# Patient Record
Sex: Male | Born: 1975 | Hispanic: No | Marital: Married | Smoking: Current every day smoker
Health system: Southern US, Community
[De-identification: ages and names within clinical notes are randomized; demographics above are authoritative.]

---

## 2002-05-28 ENCOUNTER — Encounter: Admission: RE | Admit: 2002-05-28 | Discharge: 2002-05-28 | Payer: Self-pay | Admitting: Gastroenterology

## 2002-05-28 ENCOUNTER — Encounter: Payer: Self-pay | Admitting: Gastroenterology

## 2006-12-11 ENCOUNTER — Encounter: Admission: RE | Admit: 2006-12-11 | Discharge: 2006-12-11 | Payer: Self-pay | Admitting: Internal Medicine

## 2010-09-16 ENCOUNTER — Encounter: Payer: Self-pay | Admitting: Internal Medicine

## 2013-06-20 ENCOUNTER — Emergency Department (HOSPITAL_COMMUNITY): Payer: BC Managed Care – PPO

## 2013-06-20 ENCOUNTER — Emergency Department (HOSPITAL_COMMUNITY)
Admission: EM | Admit: 2013-06-20 | Discharge: 2013-06-20 | Disposition: A | Discharge status: Home / Self Care | Payer: BC Managed Care – PPO | Attending: Emergency Medicine | Admitting: Emergency Medicine

## 2013-06-20 ENCOUNTER — Encounter (HOSPITAL_COMMUNITY): Payer: Self-pay | Admitting: Emergency Medicine

## 2013-06-20 DIAGNOSIS — N452 Orchitis: Secondary | ICD-10-CM | POA: Insufficient documentation

## 2013-06-20 DIAGNOSIS — N453 Epididymo-orchitis: Secondary | ICD-10-CM

## 2013-06-20 DIAGNOSIS — F17200 Nicotine dependence, unspecified, uncomplicated: Secondary | ICD-10-CM | POA: Insufficient documentation

## 2013-06-20 LAB — URINALYSIS, ROUTINE W REFLEX MICROSCOPIC
Bilirubin Urine: NEGATIVE
Glucose, UA: NEGATIVE mg/dL
Hgb urine dipstick: NEGATIVE
Ketones, ur: NEGATIVE mg/dL
Leukocytes, UA: NEGATIVE
Nitrite: NEGATIVE
Protein, ur: NEGATIVE mg/dL
Specific Gravity, Urine: 1.011 (ref 1.005–1.030)
Urobilinogen, UA: 0.2 mg/dL (ref 0.0–1.0)
pH: 6 (ref 5.0–8.0)

## 2013-06-20 MED ORDER — LIDOCAINE HCL 1 % IJ SOLN
INTRAMUSCULAR | Status: AC
  Administered 2013-06-20: 0.9 mL
  Filled 2013-06-20: qty 20

## 2013-06-20 MED ORDER — OXYCODONE-ACETAMINOPHEN 5-325 MG PO TABS
1.0000 | ORAL_TABLET | Freq: Once | ORAL | Status: AC
  Administered 2013-06-20: 1 via ORAL
  Filled 2013-06-20: qty 1

## 2013-06-20 MED ORDER — DOXYCYCLINE HYCLATE 100 MG PO CAPS: 100.0000 mg | ORAL_CAPSULE | Freq: Two times a day (BID) | ORAL | Status: DC

## 2013-06-20 MED ORDER — DOXYCYCLINE HYCLATE 100 MG PO TABS
100.0000 mg | ORAL_TABLET | Freq: Once | ORAL | Status: AC
  Administered 2013-06-20: 100 mg via ORAL
  Filled 2013-06-20: qty 1

## 2013-06-20 MED ORDER — OXYCODONE-ACETAMINOPHEN 5-325 MG PO TABS: 1.0000 | ORAL_TABLET | Freq: Four times a day (QID) | ORAL | Status: DC | PRN

## 2013-06-20 MED ORDER — CEFTRIAXONE SODIUM 250 MG IJ SOLR
250.0000 mg | Freq: Once | INTRAMUSCULAR | Status: AC
  Administered 2013-06-20: 250 mg via INTRAMUSCULAR
  Filled 2013-06-20: qty 250

## 2013-06-20 NOTE — ED Notes (Signed)
Pt lifting something heavy three days ago and for past two days pt having left side abd pain and groin pain.  Pt states he is unable to walk. Pt ambulated in to lobby with steady gait.

## 2013-06-20 NOTE — ED Provider Notes (Signed)
CSN: [id number]     Arrival date & time 06/20/13  1347 History   First MD Initiated Contact with Patient 06/20/13 1350     Chief Complaint  Patient presents with  . Abdominal Pain  . Groin Pain   (Consider location/radiation/quality/duration/timing/severity/associated sxs/prior Treatment) HPI Comments: Patient presents with complaint of left groin pain which began acutely 2 days ago and has gradually worsening. Patient reports that 3 days ago he was lifting a heavy TV stand and had onset of discomfort which improved. Patient reports worsening pain. Pain is worse with movement and palpation. He has had no urinary symptoms or difficulties. He is having normal bowel movements and passing gas. He's been taking ibuprofen with some relief. No history of similar symptoms. No history of abdominal surgeries.  Patient is a 37 y.o. male presenting with abdominal pain and groin pain. The history is provided by the patient.  Abdominal Pain Associated symptoms: no chest pain, no cough, no diarrhea, no dysuria, no fever, no hematuria, no nausea, no sore throat and no vomiting   Groin Pain Associated symptoms include abdominal pain. Pertinent negatives include no chest pain, coughing, fever, headaches, myalgias, nausea, rash, sore throat or vomiting.    History reviewed. No pertinent past medical history. History reviewed. No pertinent past surgical history. No family history on file. History  Substance Use Topics  . Smoking status: Current Every Day Smoker -- 10.00 packs/day    Types: Cigarettes  . Smokeless tobacco: Not on file  . Alcohol Use: Yes    Review of Systems  Constitutional: Negative for fever.  HENT: Negative for rhinorrhea and sore throat.   Eyes: Negative for redness.  Respiratory: Negative for cough.   Cardiovascular: Negative for chest pain.  Gastrointestinal: Positive for abdominal pain. Negative for nausea, vomiting and diarrhea.  Genitourinary: Positive for scrotal swelling  and testicular pain. Negative for dysuria, urgency, frequency, hematuria, discharge, penile swelling and penile pain.  Musculoskeletal: Negative for myalgias.  Skin: Negative for rash.  Neurological: Negative for headaches.    Allergies  Review of patient's allergies indicates no known allergies.  Home Medications   Current Outpatient Rx  Name  Route  Sig  Dispense  Refill  . ibuprofen (ADVIL,MOTRIN) 200 MG tablet   Oral   Take 600 mg by mouth every 6 (six) hours as needed for pain.          BP 118/88  Pulse 92  Temp(Src) 97.9 F (36.6 C) (Oral)  Resp 16  SpO2 97% Physical Exam  Nursing note and vitals reviewed. Constitutional: He appears well-developed and well-nourished.  HENT:  Head: Normocephalic and atraumatic.  Eyes: Conjunctivae are normal. Right eye exhibits no discharge. Left eye exhibits no discharge.  Neck: Normal range of motion. Neck supple.  Cardiovascular: Normal rate, regular rhythm and normal heart sounds.   Pulmonary/Chest: Effort normal and breath sounds normal.  Abdominal: Soft. There is no tenderness.  Genitourinary: Penis normal.    Left testis shows swelling and tenderness. Left testis shows no mass. Left testis is descended. No penile tenderness. No discharge found.  Lymphadenopathy:       Left: No inguinal adenopathy present.  Neurological: He is alert.  Skin: Skin is warm and dry.  Psychiatric: He has a normal mood and affect.    ED Course  Procedures (including critical care time) Labs Review Labs Reviewed  URINALYSIS, ROUTINE W REFLEX MICROSCOPIC - Abnormal; Notable for the following:    APPearance CLOUDY (*)    All other components  within normal limits   Imaging Review No results found.  EKG Interpretation   None      Patient seen and examined. Work-up initiated. [place] ordered. Patient does not want pain medication.   Vital signs reviewed and are as follows: Filed Vitals:   06/20/13 1409  BP: 118/88  Pulse: 92  Temp: 97.9  F (36.6 C)  Resp: 16   3:33 PM Pt in ultrasound.   4:09 PM ultrasound reviewed by myself. Patient informed of results. Rocephin and doxycycline ordered. Patient requesting pain medication.  Patient has primary care physicians with whom he can followup in 3 days if not improved. Urged to return with worsening symptoms, persistent fever, or other concerns. Patient verbalizes understanding and agrees with plan.  Patient counseled on use of narcotic pain medications. Counseled not to combine these medications with others containing tylenol. Urged not to drink alcohol, drive, or perform any other activities that requires focus while taking these medications. The patient verbalizes understanding and agrees with the plan.   MDM   1. Epididymo-orchitis, acute    Testicular pain: [place] c/w orchitis. Abx given (rocephin, doxycycline). Less likely inguinal hernia.     Renne Crigler, PA-C 06/20/13 [phone]

## 2013-06-24 NOTE — ED Provider Notes (Signed)
Medical screening examination/treatment/procedure(s) were performed by non-physician practitioner and as supervising physician I was immediately available for consultation/collaboration.  EKG Interpretation   None         Kristen N Ward, DO 06/24/13 1526 

## 2015-04-11 IMAGING — US US ART/VEN ABD/PELV/SCROTUM DOPPLER LTD
1 series · 13 of 25 positions shown · non-contrast
Comparison: None.

CLINICAL DATA: Left testicular pain

EXAM:
SCROTAL ULTRASOUND
DOPPLER ULTRASOUND OF THE TESTICLES
TECHNIQUE: Complete ultrasound examination of the testicles, epididymis, and
other scrotal structures was performed. Color and spectral Doppler
ultrasound were also utilized to evaluate blood flow to the
testicles.

[Series 1: us art/ven abd/pelv/scrotum doppler ltd · 0.06mm/px · 13 of 40 slices shown]
[im 1/40]
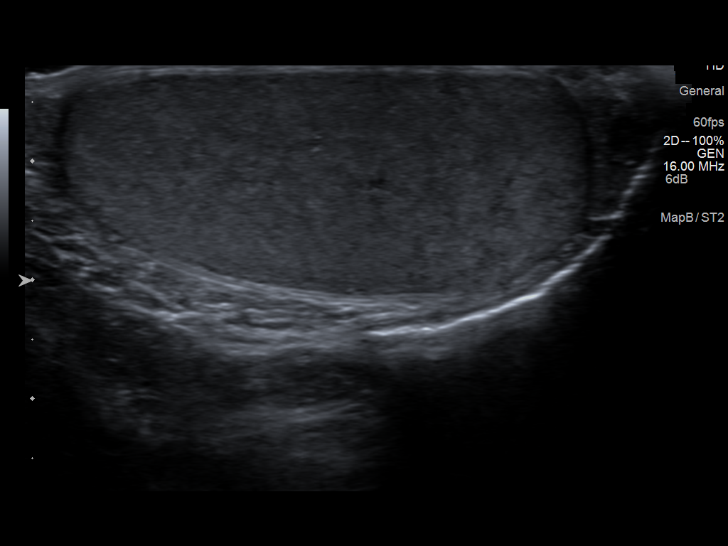
[im 4/40]
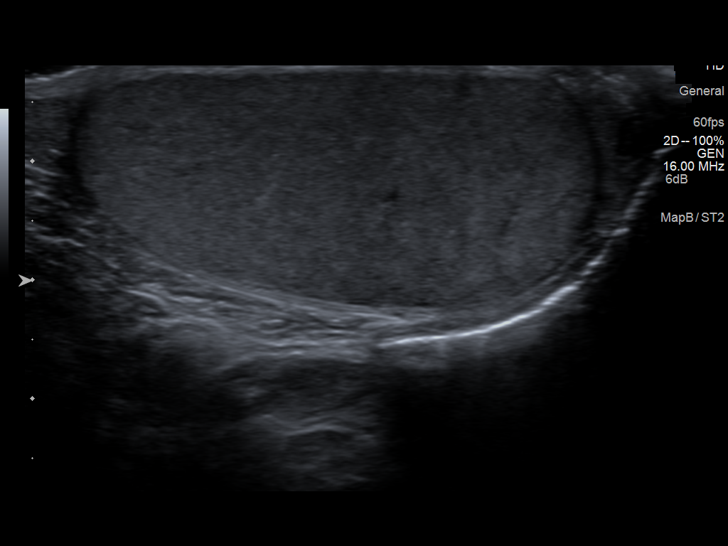
[im 7/40]
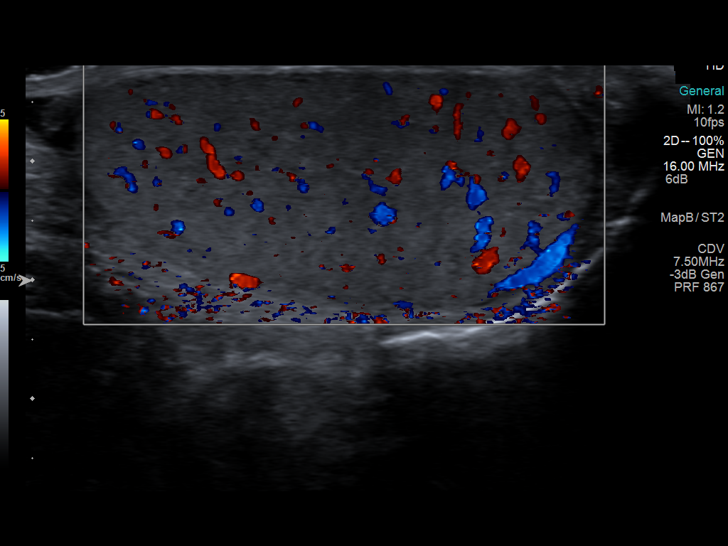
[im 10/40]
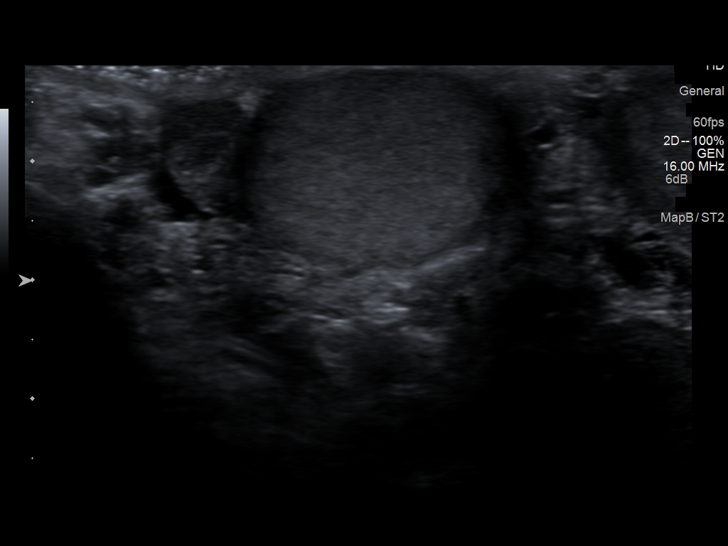
[im 14/40]
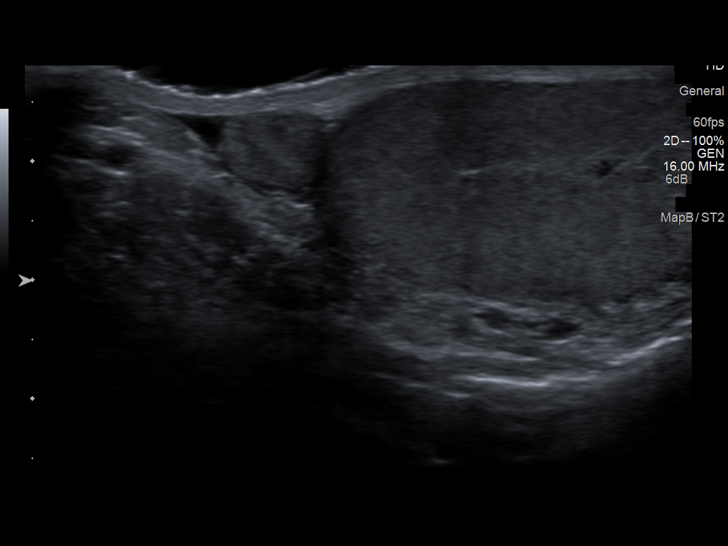
[im 17/40]
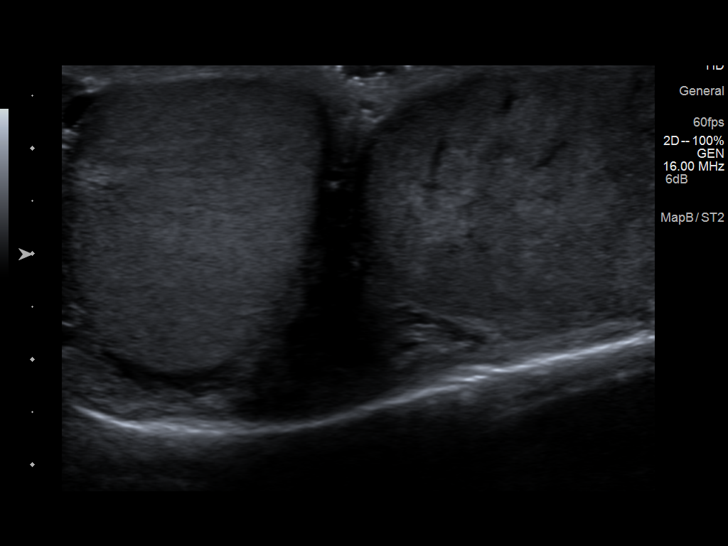
[im 20/40]
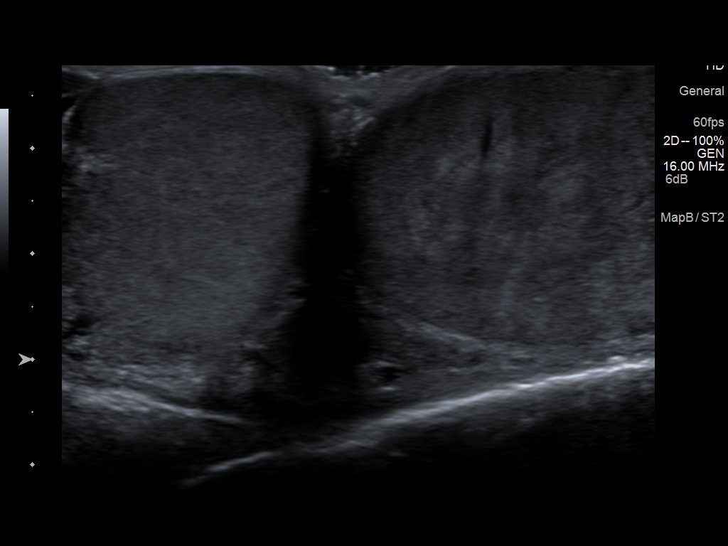
[im 23/40]
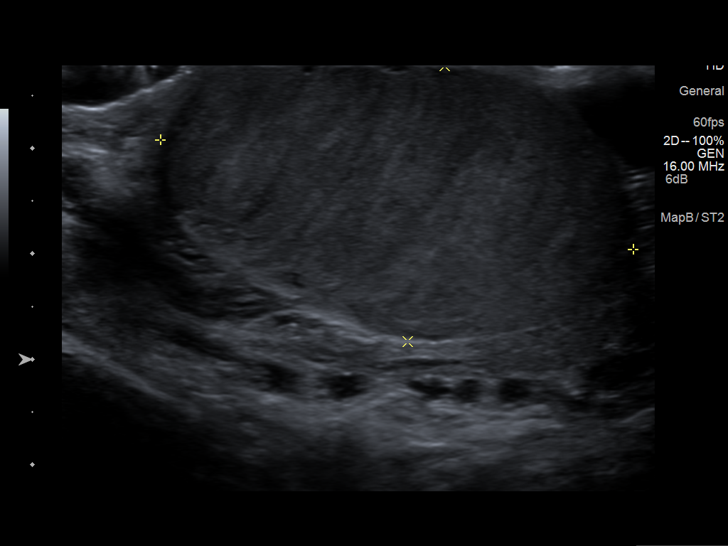
[im 27/40]
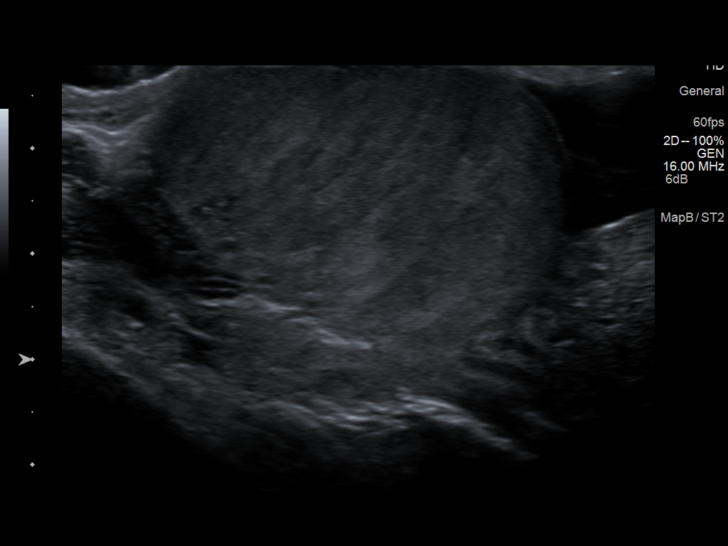
[im 30/40]
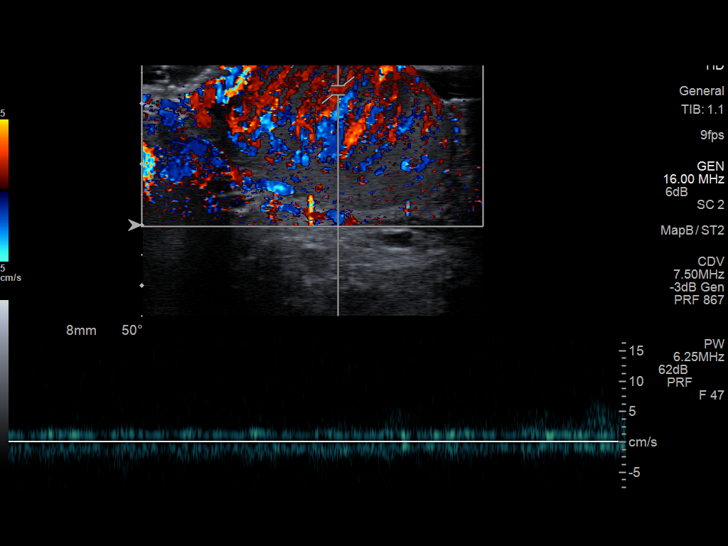
[im 33/40]
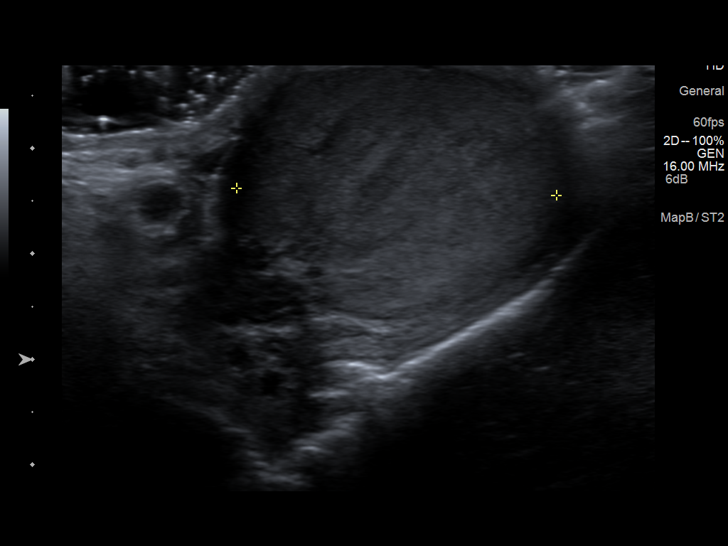
[im 36/40]
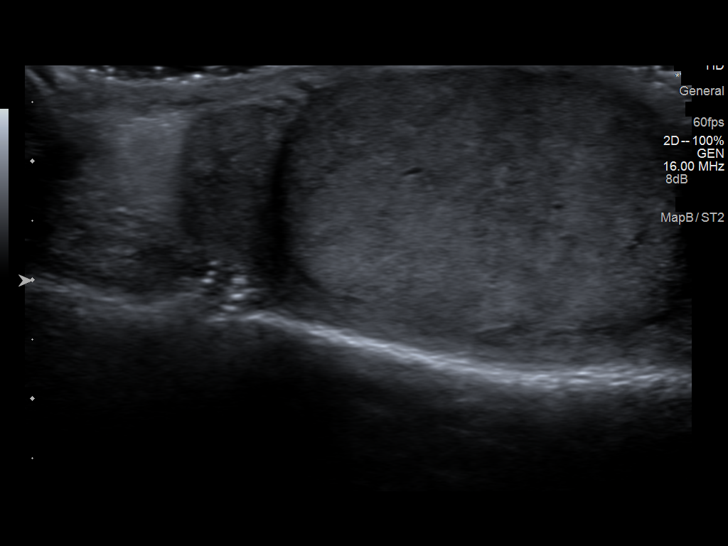
[im 40/40]
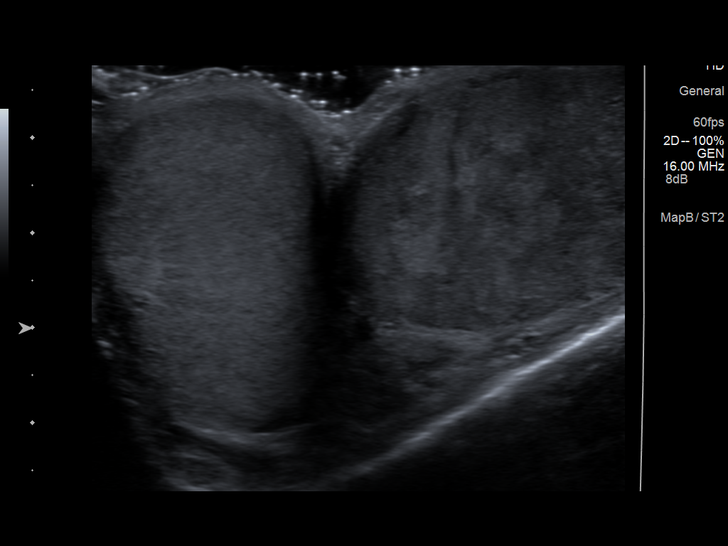

[13 of 25 positions shown; findings below may reference images not displayed]

FINDINGS: Right testicle

Measurements: 4.5 x 1.9 x 2.7 cm No mass or microlithiasis
visualized. There is normal call or Doppler flow. Normal arterial
and venous waveform.

Left testicle

Measurements: 4.6 x 2.6 x 3 cm. No mass or microlithiasis
visualized. No evidence of testicular torsion. Normal arterial and
venous waveform. There is diffuse increased heterogeneous flow in
the left testicle. Findings are highly suspicious for orchitis.
Clinical correlation is necessary.

Right epididymis:  Normal in size and appearance.

Left epididymis:  Normal in size and appearance.

Hydrocele:  Small bilateral hydrocele.

Varicocele:  None visualized.

Pulsed Doppler interrogation of both testes demonstrates low
resistance arterial and venous waveforms bilaterally.
IMPRESSION: 1. Unremarkable right testicle. No evidence of left testicular
torsion. Diffuse increased flow in left testicle highly suspicious
for orchitis. Unremarkable bilateral epididymis. Small bilateral
hydrocele.

## 2017-11-14 ENCOUNTER — Ambulatory Visit (INDEPENDENT_AMBULATORY_CARE_PROVIDER_SITE_OTHER): Payer: No Typology Code available for payment source

## 2017-11-14 ENCOUNTER — Encounter: Payer: Self-pay | Admitting: Podiatry

## 2017-11-14 ENCOUNTER — Ambulatory Visit (INDEPENDENT_AMBULATORY_CARE_PROVIDER_SITE_OTHER): Payer: No Typology Code available for payment source | Admitting: Podiatry

## 2017-11-14 VITALS — BP 108/66 | HR 67 | Resp 16

## 2017-11-14 DIAGNOSIS — M898X9 Other specified disorders of bone, unspecified site: Secondary | ICD-10-CM | POA: Diagnosis not present

## 2017-11-17 NOTE — Progress Notes (Signed)
Subjective:   Patient ID: Francis Alvarez, male   DOB: 42 y.o.   MRN: [id number]   HPI Patient presents stating that he has a very painful lesion on the inside of the fifth toe of his right foot and states that he is tried to trim it and is been to another doctor to work on it and it did not help and it is increasingly making it hard to wear shoe gear.  Patient smokes approximately 1/2 pack/day and likes to be active   Review of Systems  All other systems reviewed and are negative.       Objective:  Physical Exam  Constitutional: He appears well-developed and well-nourished.  Cardiovascular: Intact distal pulses.  Pulmonary/Chest: Effort normal.  Musculoskeletal: Normal range of motion.  Neurological: He is alert.  Skin: Skin is warm.  Nursing note and vitals reviewed.   Neurovascular status found to be intact muscle strength is adequate range of motion within normal limits with patient noted to have keratotic lesion on the inside of the fifth digit right is very painful when pressed with lucent-like core to it and rotation of the fifth digit pressing against the fourth toe.  Patient was noted to have good digital perfusion and is well oriented x3     Assessment:  Strong probability that this is a small bone spur formation with keratotic irritated lesion formation secondary to structure of the digit     Plan:  H&P and x-ray reviewed with patient.  Due to the discomfort and failure to respond to conservative treatment wider shoes and padding I recommended exostectomy and explained this can be done in the office.  I did explain at one point in the future the derotational arthroplasty may be necessary and at this time I let patient read consent form going over alternative treatments and complications.  Patient is willing to accept the risk of surgery and wants this done and signed consent form after extensive review and is scheduled for outpatient surgery at this time.  Patient was given  all instructions understanding recovery will take several months and is encouraged to call with questions  X-rays indicate that there is rotation of the fifth digit right with abnormal pressure between the fourth and fifth toes

## 2017-11-21 ENCOUNTER — Telehealth: Payer: Self-pay | Admitting: *Deleted

## 2017-11-21 NOTE — Telephone Encounter (Addendum)
I am calling to see if we can reschedule your surgery.  You had requested April 10.  Dr. Charlsie Merlesegal will not be in the office that day.  Can we schedule it the following week on April 15 or 17?  "Yes, that will be fine.  Let's do it on April 17."  I will get it rescheduled, be here at 7:45 am.

## 2017-12-02 ENCOUNTER — Other Ambulatory Visit: Payer: Self-pay | Admitting: Family Medicine

## 2017-12-02 ENCOUNTER — Ambulatory Visit
Admission: RE | Admit: 2017-12-02 | Discharge: 2017-12-02 | Disposition: A | Discharge status: Home / Self Care | Payer: No Typology Code available for payment source | Source: Ambulatory Visit | Attending: Family Medicine | Admitting: Family Medicine

## 2017-12-02 DIAGNOSIS — R0789 Other chest pain: Secondary | ICD-10-CM

## 2017-12-05 ENCOUNTER — Telehealth: Payer: Self-pay | Admitting: *Deleted

## 2017-12-05 NOTE — Telephone Encounter (Signed)
I am calling you in regards to your surgery scheduled for 12/10/2017.  You have a pre-existing clause.  Have you been treated for this problem before getting this insurance?  "No, I haven't.  There's no worries if my insurance doesn't cover it. I will take care of everything."  You should be okay if you didn't receive treatment for this problem last year.  "I saw a Dermatologist about it earlier this year."

## 2017-12-10 ENCOUNTER — Encounter: Payer: Self-pay | Admitting: Podiatry

## 2017-12-10 ENCOUNTER — Ambulatory Visit (INDEPENDENT_AMBULATORY_CARE_PROVIDER_SITE_OTHER): Payer: No Typology Code available for payment source | Admitting: Podiatry

## 2017-12-10 VITALS — BP 109/63 | HR 72 | Resp 16

## 2017-12-10 DIAGNOSIS — M898X9 Other specified disorders of bone, unspecified site: Secondary | ICD-10-CM

## 2017-12-10 NOTE — Progress Notes (Signed)
Subjective:   Patient ID: Francis Alvarez, male   DOB: 42 y.o.   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  The following procedure was performed attention was directed dorsal medial aspect digit 5 right were some elliptical incision was made centered over the offending lesion.  The incision was deepened down to bone and the offending skin wedge was removed in toto and the bone was exposed.  Utilizing a bone bur this was rasped down and found to be satisfactorily resected.  The wound was flushed with copies months Garamycin solution and sutured with 4-0 nylon and sterile dressing applied tourniquet released and capillary fill is noted to be immediate to all digits on the toe and the patient left the OR in satisfactory condition

## 2017-12-24 ENCOUNTER — Other Ambulatory Visit: Payer: No Typology Code available for payment source

## 2017-12-26 ENCOUNTER — Encounter: Payer: Self-pay | Admitting: Podiatry

## 2017-12-26 ENCOUNTER — Ambulatory Visit (INDEPENDENT_AMBULATORY_CARE_PROVIDER_SITE_OTHER): Payer: No Typology Code available for payment source | Admitting: Podiatry

## 2017-12-26 DIAGNOSIS — M898X9 Other specified disorders of bone, unspecified site: Secondary | ICD-10-CM

## 2017-12-29 NOTE — Progress Notes (Signed)
Subjective:   Patient ID: Francis Alvarez, male   DOB: 42 y.o.   MRN: [id number]   HPI Patient presents doing well with my right foot with minimal pain and I have been back working since a couple days after surgery   ROS      Objective:  Physical Exam  Neurovascular status intact with patient's right fifth digit healing well wound edges well coapted and good alignment noted     Assessment:  Doing well post exostectomy fifth digit right     Plan:  Stitches removed wound edges coapted well and advised this patient on gradual return to activities and applied Band-Aid to the area  X-rays indicate satisfactory resection of bone with no indications of pathology

## 2017-12-30 ENCOUNTER — Telehealth: Payer: Self-pay | Admitting: Podiatry

## 2017-12-30 NOTE — Telephone Encounter (Signed)
Patient had surgery (Exostectomy) on (12/10/17). He came in 12/26/17 and had his stitches removed. He said since then when he goes to put his shoes on he's in a lot of pain. If you can call him back at [phone]

## 2017-12-30 NOTE — Telephone Encounter (Signed)
Pt states he is still having pain and swelling in the little toe after going into the soft plastic shoe. I told pt he may benefit from a soft sided athletic shoe, he may have swelling on and off to varying degrees for 6-9 months, that he should elevate and ice the area 15 minutes periodically

## 2018-01-08 ENCOUNTER — Ambulatory Visit (INDEPENDENT_AMBULATORY_CARE_PROVIDER_SITE_OTHER): Payer: No Typology Code available for payment source

## 2018-01-08 ENCOUNTER — Ambulatory Visit (INDEPENDENT_AMBULATORY_CARE_PROVIDER_SITE_OTHER): Payer: Self-pay | Admitting: Podiatry

## 2018-01-08 DIAGNOSIS — M898X9 Other specified disorders of bone, unspecified site: Secondary | ICD-10-CM

## 2018-01-08 NOTE — Progress Notes (Signed)
Subjective:   Patient ID: Francis Alvarez, male   DOB: 42 y.o.   MRN: [id number]   HPI Patient presents with distal keratotic lesion that is irritated on the end of the fifth digit right not where we did the surgery.  Patient states he is not sure what he may have done but this   ROS      Objective:  Physical Exam  Neurovascular status intact with distal irritated skin right fifth digit with incision site that healed excellent on the medial side fifth toe right     Assessment:  Possibility for some form of inflammatory tissue exostotic tissue distal aspect digit 5 right     Plan:  H&P condition reviewed and applied padding to the area and reviewed x-ray.  Do not see anything else for [place] to do but ultimately we may require more aggressive treatment  X-ray indicates that there is satisfactory resection of bone medial side digit 5 right with no other pathology noted

## 2019-09-23 IMAGING — CR DG CHEST 2V
2 series · 2 of 2 positions shown · non-contrast
Comparison: None.

CLINICAL DATA: Chest pressure, left back pain.

EXAM:
CHEST - 2 VIEW

[w chest pa]
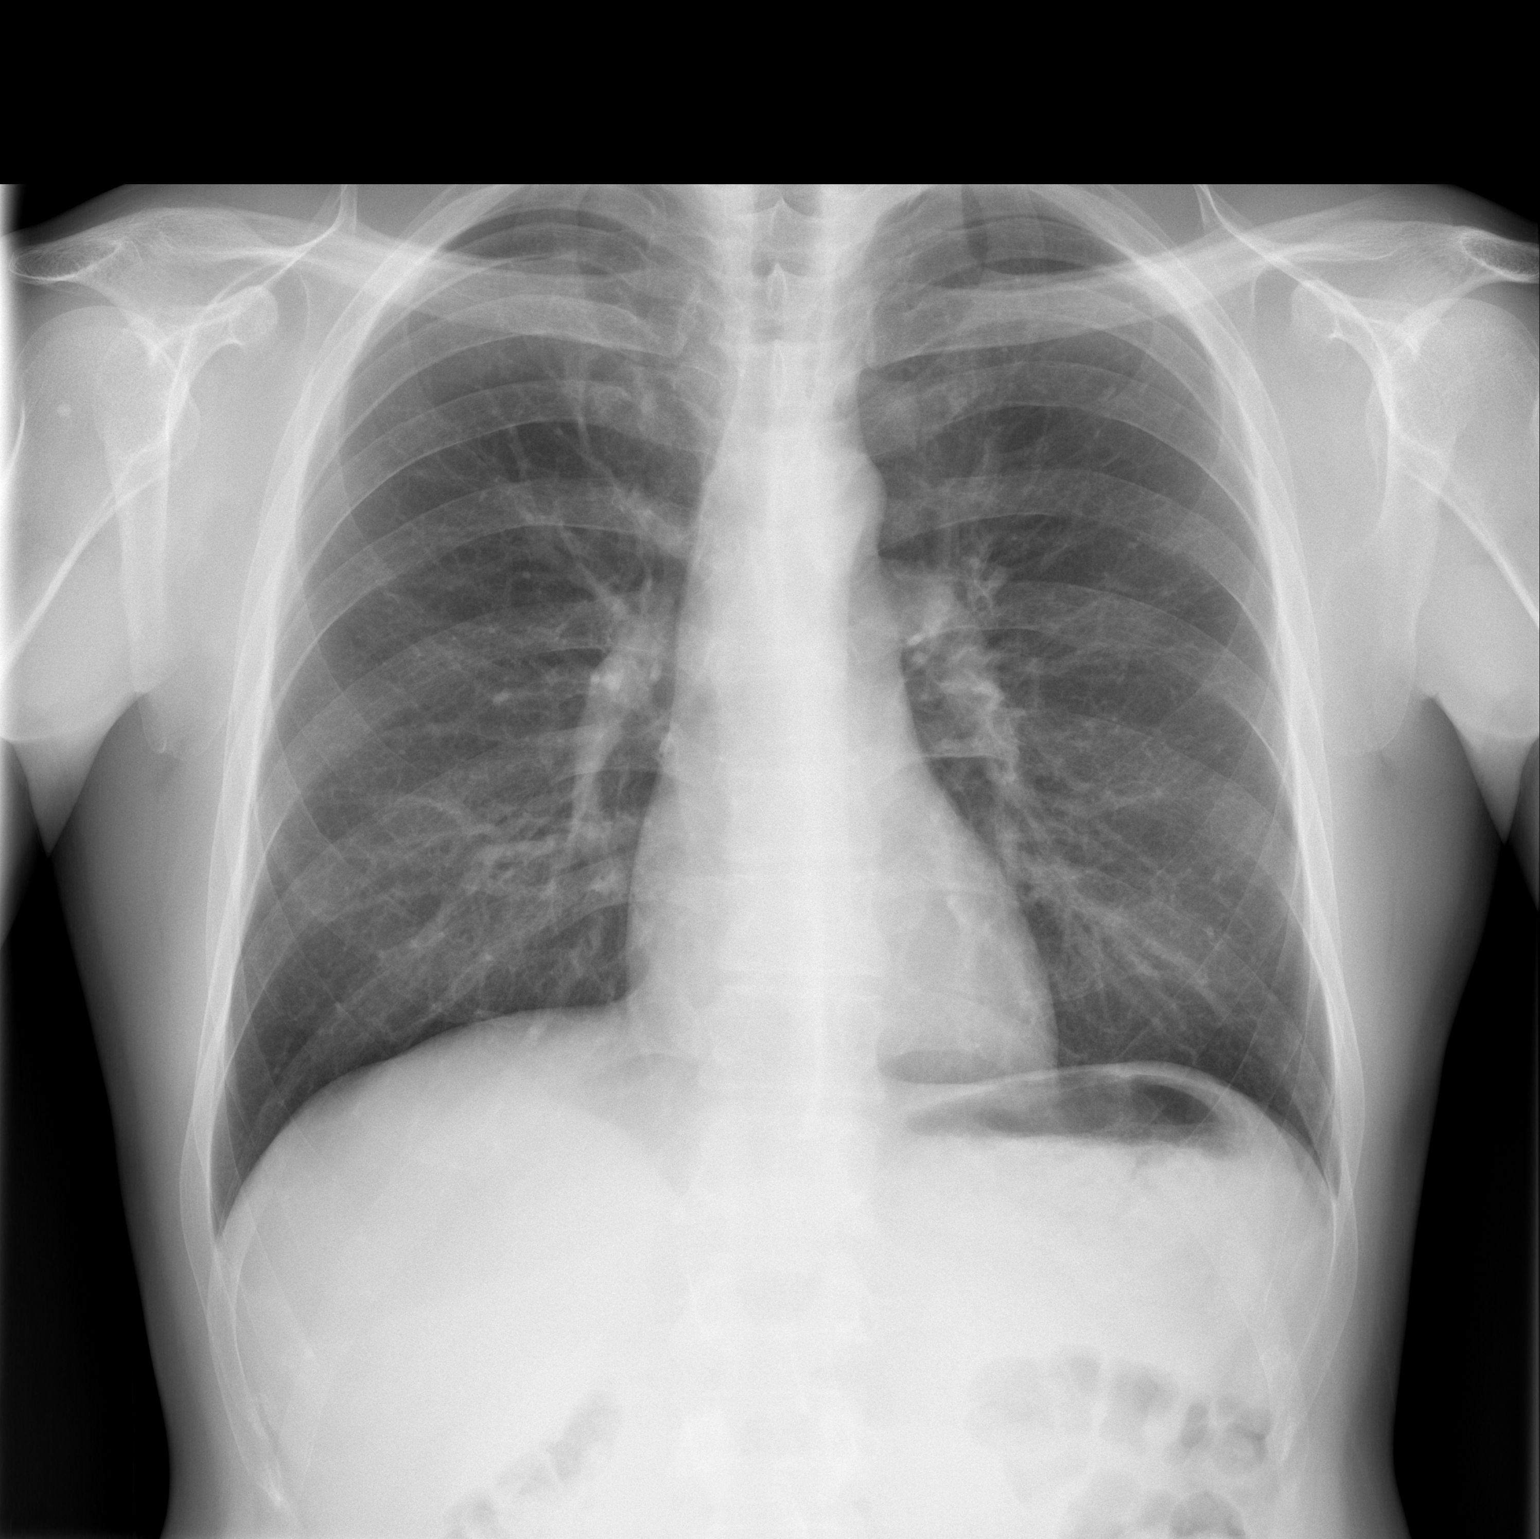

[w chest lat]
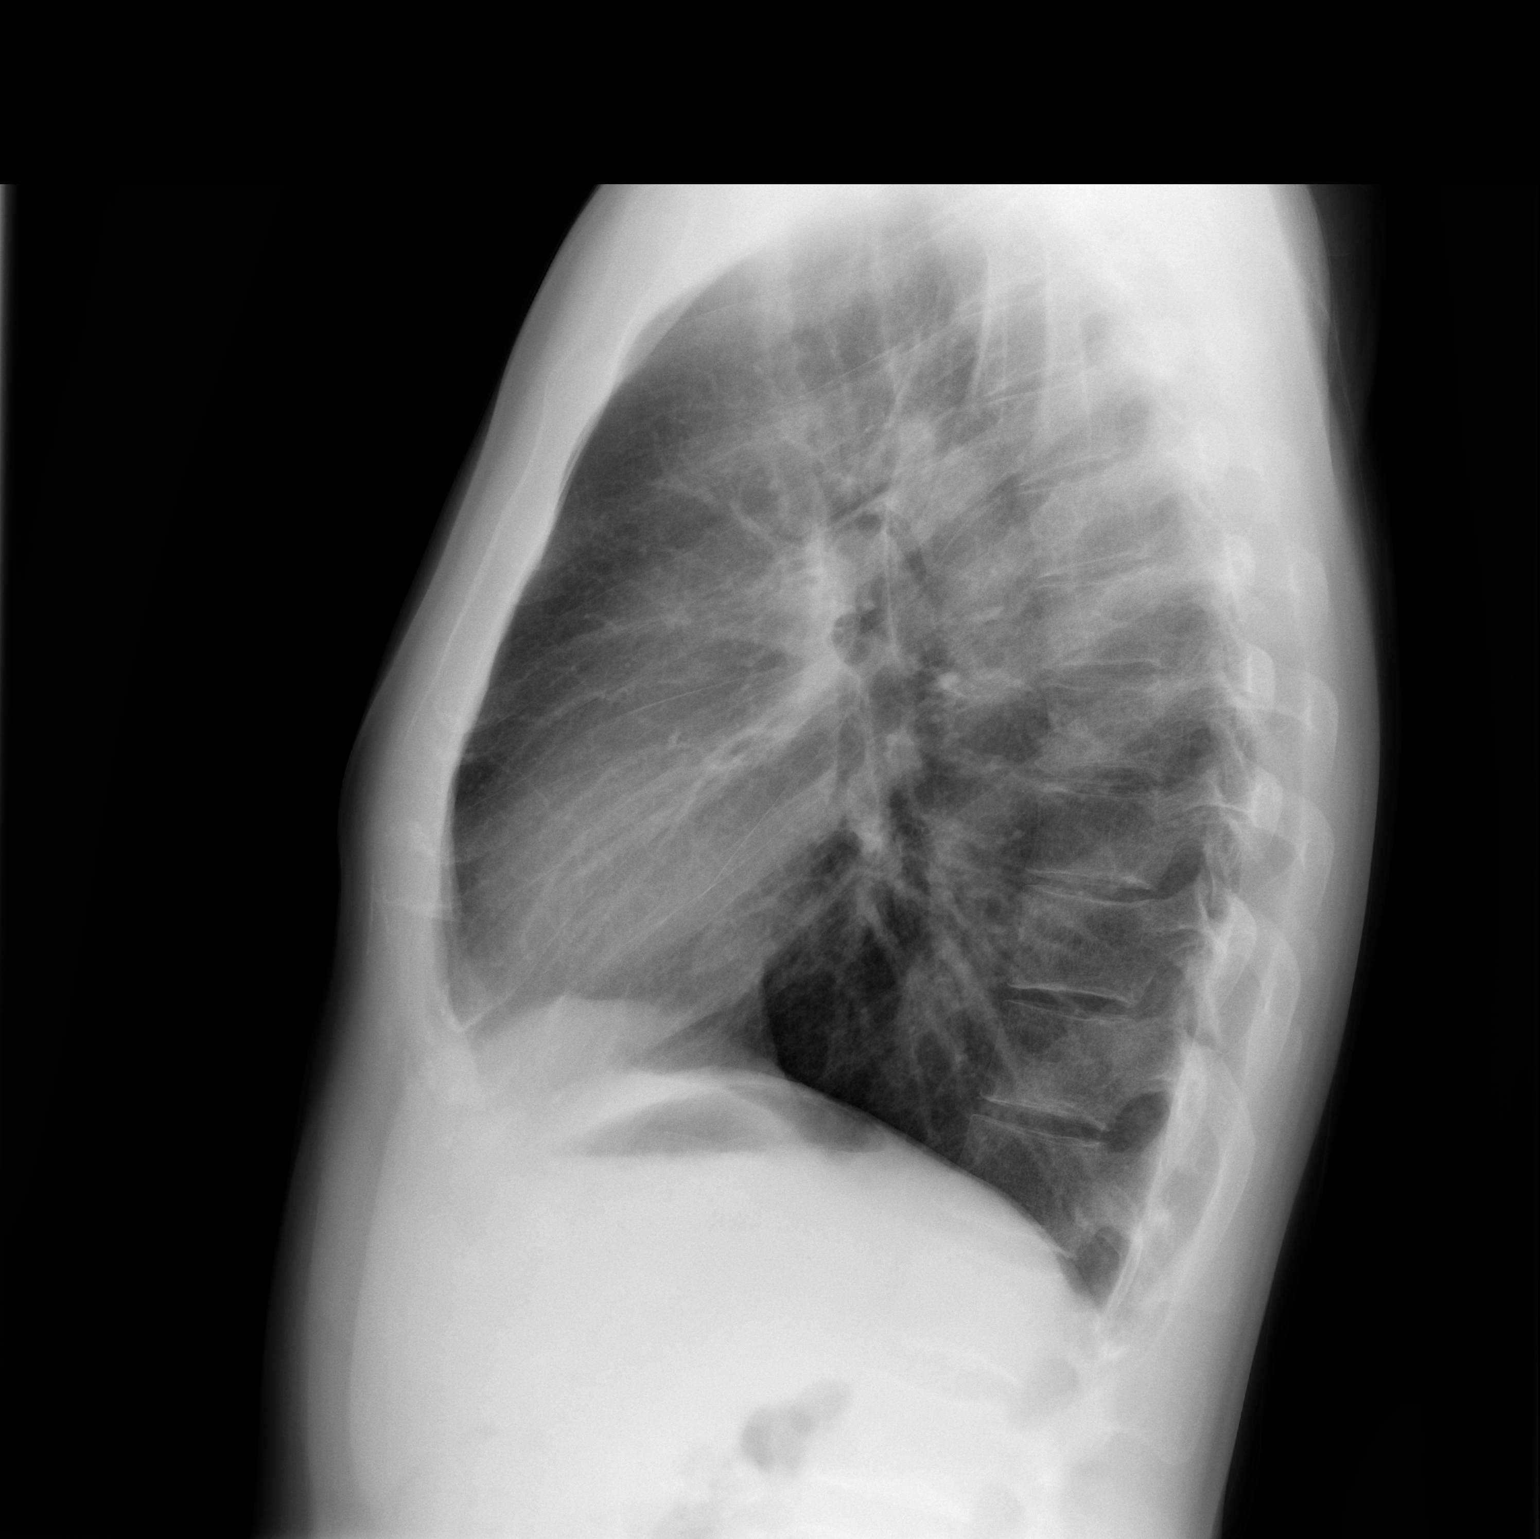

[2 of 2 positions shown; findings below may reference images not displayed]

FINDINGS: Heart and mediastinal contours are within normal limits. No focal
opacities or effusions. No acute bony abnormality.
IMPRESSION: No active cardiopulmonary disease.

## 2021-05-07 ENCOUNTER — Other Ambulatory Visit: Payer: Self-pay

## 2021-05-07 ENCOUNTER — Ambulatory Visit (HOSPITAL_BASED_OUTPATIENT_CLINIC_OR_DEPARTMENT_OTHER): Payer: No Typology Code available for payment source | Attending: Sports Medicine | Admitting: Physical Therapy

## 2021-05-07 ENCOUNTER — Encounter (HOSPITAL_BASED_OUTPATIENT_CLINIC_OR_DEPARTMENT_OTHER): Payer: Self-pay | Admitting: Physical Therapy

## 2021-05-07 DIAGNOSIS — M62830 Muscle spasm of back: Secondary | ICD-10-CM | POA: Diagnosis present

## 2021-05-07 DIAGNOSIS — G8929 Other chronic pain: Secondary | ICD-10-CM | POA: Diagnosis present

## 2021-05-07 DIAGNOSIS — M545 Low back pain, unspecified: Secondary | ICD-10-CM | POA: Diagnosis present

## 2021-05-07 DIAGNOSIS — M6281 Muscle weakness (generalized): Secondary | ICD-10-CM | POA: Diagnosis present

## 2021-05-07 NOTE — Therapy (Signed)
[facility] [address] [place], [place], [address] Phone: [phone]   Fax:  [phone]  Physical Therapy Evaluation  Patient Details  Name: Francis Alvarez MRN: [id number] Date of Birth: 02/26/1976 Referring Provider (PT): Cleophas Dunker   Encounter Date: 05/07/2021   PT End of Session - 05/07/21 0922     Visit Number 1    Number of Visits 13    Date for PT Re-Evaluation 06/22/21    Authorization Type [employer]    PT Start Time 0845    PT Stop Time 0925    PT Time Calculation (min) 40 min    Activity Tolerance Patient tolerated treatment well    Behavior During Therapy [facility] for tasks assessed/performed             History reviewed. No pertinent past medical history.  History reviewed. No pertinent surgical history.  There were no vitals filed for this visit.    Subjective Assessment - 05/07/21 0847     Subjective Started working out around Jan and after a month I started having pain here- pointing to SIJ bilat. Could hardly bend. Now I have a good pinch that is on and off. Was doing kick boxing but was not exercising before and not any more. A little numbness on Sat but it went away and have never had it before.    Patient Stated Goals squatting, bending, lifting pizzas- reaching fwd with Rt arm    Currently in Pain? Yes    Pain Score 2     Pain Location Back    Pain Orientation Lower;Right;Left    Pain Descriptors / Indicators --   pinching   Aggravating Factors  bending, squatting    Pain Relieving Factors rest                [facility] PT Assessment - 05/07/21 0001       Assessment   Medical Diagnosis LBP    Referring Provider (PT) Cleophas Dunker    Onset Date/Surgical Date 09/26/20    Hand Dominance Right    Prior Therapy no      Precautions   Precautions None      Restrictions   Weight Bearing Restrictions No      Balance Screen   Has the patient fallen in the past 6 months No      Prior Function    Vocation Full time employment    Vocation Requirements [employer]      Cognition   Overall Cognitive Status Within Functional Limits for tasks assessed      Sensation   Additional Comments [facility]      Posture/Postural Control   Posture Comments incr lumbar lordosis      ROM / Strength   AROM / PROM / Strength AROM;Strength      AROM   AROM Assessment Site Lumbar    Lumbar Flexion just below knee      Strength   Overall Strength Comments bil hip abd 4/5      Flexibility   Soft Tissue Assessment /Muscle Length yes    Hamstrings <45 deg bil      Palpation   Palpation comment tihgness bil to L5 with concordant pain reported                        Objective measurements completed on examination: See above findings.       [facility] Adult PT Treatment/Exercise - 05/07/21 0001  Exercises   Exercises Knee/Hip      Knee/Hip Exercises: Stretches   Passive Hamstring Stretch Limitations seated EOB    Piriformis Stretch Limitations seated EOB    Gastroc Stretch Limitations standing lunge      Knee/Hip Exercises: Standing   Other Standing Knee Exercises seated and standing hip hinge with dowel      Manual Therapy   Manual Therapy Joint mobilization;Soft tissue mobilization    Joint Mobilization L5 lateral & central PA gr 3    Soft tissue mobilization lumbar paraspinals, Lt glut med/min, piriformis                     PT Education - 05/07/21 1942     Education Details anatomy of condition, POC, HEP, exercise form/rationale    Person(s) Educated Patient    Methods Explanation;Demonstration;Tactile cues;Verbal cues;Handout    Comprehension Verbalized understanding;Returned demonstration;Verbal cues required;Tactile cues required;Need further instruction              PT Short Term Goals - 05/07/21 1947       PT SHORT TERM GOAL #1   Title pt will demo proper hip hinge in standing    Baseline poor form at eval- utilizing motion via  lumbar spine    Time 3    Period Weeks    Status New    Target Date 05/31/21               PT Long Term Goals - 05/07/21 1948       PT LONG TERM GOAL #1   Title pt will be I with long term HEP    Baseline will progress and establish as appropriate    Time 6    Period Weeks    Status New    Target Date 06/22/21      PT LONG TERM GOAL #2   Title bil hip abd strength 5/5    Baseline see flowsheet    Time 6    Period Weeks    Status New    Target Date 06/22/21      PT LONG TERM GOAL #3   Title pt will be able to lift and navigate pizzas in and out of oven without back pain    Baseline pain notable at eval    Time 6    Period Weeks    Status New    Target Date 06/22/21      PT LONG TERM GOAL #4   Title pt will report resolution of pinching pain with ADLs    Baseline frequent at eval    Time 6    Period Weeks    Status New    Target Date 06/22/21                    Plan - 05/07/21 1610     Clinical Impression Statement Pt presents to PT with complaints of LBP that began in Feb after starting a workout regimen in Jan that consisted of kick boxing. He is very tight through bil hamstrings and has poor hip abd strength indicating a lack of lumbopelvic support to spine. 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 Will do DN at his next visit to address trigger points found in lumbar and hip region then move to aquatics in the following visits. Pt will benefit from skilled PT to address deficits and reach functional goals.  Personal Factors and Comorbidities Fitness;Profession    Examination-Activity Limitations Bend;Sit;Carry;Squat;Stand;Lift    Examination-Participation Restrictions Meal Prep;Occupation    Stability/Clinical Decision Making Stable/Uncomplicated    Clinical Decision Making Low    Rehab Potential Good    PT Frequency 2x / week    PT Duration 6 weeks    PT Treatment/Interventions  ADLs/Self Care Home Management;Aquatic Therapy;Cryotherapy;Electrical Stimulation;Ultrasound;Traction;Moist Heat;Iontophoresis 4mg /ml Dexamethasone;Functional mobility training;Therapeutic activities;Therapeutic exercise;Neuromuscular re-education;Manual techniques;Patient/family education;Passive range of motion;Dry needling;Taping    PT Next Visit Plan DN to L5 paraspinals, prog core HEP    PT Home Exercise Plan [id number]  come to upright posture every 45 min when at work    and Agree with Plan of Care Patient             Patient will benefit from skilled therapeutic intervention in order to improve the following deficits and impairments:  Increased muscle spasms, Decreased activity tolerance, Pain, Improper body mechanics, Impaired flexibility, Decreased strength, Postural dysfunction  Visit Diagnosis: Chronic midline low back pain without sciatica - Plan: PT plan of care cert/re-cert  Muscle spasm of back - Plan: PT plan of care cert/re-cert  Muscle weakness (generalized) - Plan: PT plan of care cert/re-cert     Problem List There are no problems to display for this patient.  Babak Lucus C. Ellery Meroney PT, DPT 05/07/21 7:54 PM   [facility] [address] [place], [place], [place] Phone: [phone]   Fax:  [phone]  Name: Francis Alvarez MRN: [name] Date of Birth: [birth year]

## 2021-05-18 ENCOUNTER — Other Ambulatory Visit: Payer: Self-pay

## 2021-05-18 ENCOUNTER — Encounter (HOSPITAL_BASED_OUTPATIENT_CLINIC_OR_DEPARTMENT_OTHER): Payer: Self-pay | Admitting: Physical Therapy

## 2021-05-18 ENCOUNTER — Ambulatory Visit (HOSPITAL_BASED_OUTPATIENT_CLINIC_OR_DEPARTMENT_OTHER): Payer: No Typology Code available for payment source | Admitting: Physical Therapy

## 2021-05-18 DIAGNOSIS — M62830 Muscle spasm of back: Secondary | ICD-10-CM

## 2021-05-18 DIAGNOSIS — M6281 Muscle weakness (generalized): Secondary | ICD-10-CM

## 2021-05-18 DIAGNOSIS — M545 Low back pain, unspecified: Secondary | ICD-10-CM | POA: Diagnosis not present

## 2021-05-18 DIAGNOSIS — G8929 Other chronic pain: Secondary | ICD-10-CM

## 2021-05-18 NOTE — Therapy (Signed)
[facility] [address] [place], [place], [address] Phone: [phone]   Fax:  [phone]  Physical Therapy Treatment  Patient Details  Name: Francis Alvarez MRN: [id number] Date of Birth: [birth year] Referring Provider (PT): Cleophas Dunker   Encounter Date: 05/18/2021   PT End of Session - 05/18/21 0802     Visit Number 2    Number of Visits 13    Date for PT Re-Evaluation 06/22/21    Authorization Type UHC    PT Start Time 0800    PT Stop Time 0842    PT Time Calculation (min) 42 min    Activity Tolerance Patient tolerated treatment well    Behavior During Therapy [facility] for tasks assessed/performed             History reviewed. No pertinent past medical history.  History reviewed. No pertinent surgical history.  There were no vitals filed for this visit.   Subjective Assessment - 05/18/21 0802     Subjective I feel okay right now. Did not do the exercises too much.    Patient Stated Goals squatting, bending, lifting pizzas- reaching fwd with Rt arm    Currently in Pain? No/denies                               [facility] Adult PT Treatment/Exercise - 05/18/21 0001       Knee/Hip Exercises: Stretches   Passive Hamstring Stretch Limitations seated EOB      Knee/Hip Exercises: Aerobic   Stationary Bike 5 min L2      Knee/Hip Exercises: Standing   Other Standing Knee Exercises sit<>stand with UE ball squeeze to assist with ab set      Knee/Hip Exercises: Supine   Bridges with [employer] Pies Squeeze Strengthening;Both;15 reps    Straight Leg Raises Limitations legs extended- core use for pelvic stability    Other Supine Knee/Hip Exercises ab set with breathing      Knee/Hip Exercises: Sidelying   Hip ABduction Both;Strengthening;20 reps      Manual Therapy   Manual therapy comments skilled palpation and monitoring during TDN    Joint Mobilization L3-4 central PA    Soft tissue mobilization bil lumbar  paraspinals              Trigger Point Dry Needling - 05/18/21 0001     Consent Given? Yes    Education Handout Provided --   verbal education   Other Dry Needling L4 & 5 bil paraspinals                     PT Short Term Goals - 05/07/21 1947       PT SHORT TERM GOAL #1   Title pt will demo proper hip hinge in standing    Baseline poor form at eval- utilizing motion via lumbar spine    Time 3    Period Weeks    Status New    Target Date 05/31/21               PT Long Term Goals - 05/07/21 1948       PT LONG TERM GOAL #1   Title pt will be I with long term HEP    Baseline will progress and establish as appropriate    Time 6    Period Weeks    Status New    Target Date 06/22/21  PT LONG TERM GOAL #2   Title bil hip abd strength 5/5    Baseline see flowsheet    Time 6    Period Weeks    Status New    Target Date 06/22/21      PT LONG TERM GOAL #3   Title pt will be able to lift and navigate pizzas in and out of oven without back pain    Baseline pain notable at eval    Time 6    Period Weeks    Status New    Target Date 06/22/21      PT LONG TERM GOAL #4   Title pt will report resolution of pinching pain with ADLs    Baseline frequent at eval    Time 6    Period Weeks    Status New    Target Date 06/22/21                   Plan - 05/18/21 0839     Clinical Impression Statement Burning in muscles during sidelying hip abduction. Pt had difficulty engaging core musculature and said he will continue to work on this. Good tolerance to DN with notable decrease in muscular tension.    PT Treatment/Interventions ADLs/Self Care Home Management;Aquatic Therapy;Cryotherapy;Electrical Stimulation;Ultrasound;Traction;Moist Heat;Iontophoresis 4mg /ml Dexamethasone;Functional mobility training;Therapeutic activities;Therapeutic exercise;Neuromuscular re-education;Manual techniques;Patient/family education;Passive range of motion;Dry  needling;Taping    PT Next Visit Plan outcome of DN? progress hip abd strength    PT Home Exercise Plan [id number]  come to upright posture every 45 min when at work    and Agree with Plan of Care Patient             Patient will benefit from skilled therapeutic intervention in order to improve the following deficits and impairments:  Increased muscle spasms, Decreased activity tolerance, Pain, Improper body mechanics, Impaired flexibility, Decreased strength, Postural dysfunction  Visit Diagnosis: Chronic midline low back pain without sciatica  Muscle spasm of back  Muscle weakness (generalized)     Problem List There are no problems to display for this patient.  Kimara Bencomo C. Nichola Cieslinski PT, DPT 05/18/21 8:46 AM   [facility] [address] [place], [place], [place] Phone: [phone]   Fax:  [phone]  Name: Finch Costanzo MRN: [name] Date of Birth: February 14, 1976

## 2021-05-22 ENCOUNTER — Ambulatory Visit (HOSPITAL_BASED_OUTPATIENT_CLINIC_OR_DEPARTMENT_OTHER): Payer: No Typology Code available for payment source | Admitting: Physical Therapy

## 2021-05-22 ENCOUNTER — Other Ambulatory Visit: Payer: Self-pay

## 2021-05-22 ENCOUNTER — Encounter (HOSPITAL_BASED_OUTPATIENT_CLINIC_OR_DEPARTMENT_OTHER): Payer: Self-pay | Admitting: Physical Therapy

## 2021-05-22 DIAGNOSIS — M62830 Muscle spasm of back: Secondary | ICD-10-CM

## 2021-05-22 DIAGNOSIS — M545 Low back pain, unspecified: Secondary | ICD-10-CM | POA: Diagnosis not present

## 2021-05-22 DIAGNOSIS — G8929 Other chronic pain: Secondary | ICD-10-CM

## 2021-05-22 DIAGNOSIS — M6281 Muscle weakness (generalized): Secondary | ICD-10-CM

## 2021-05-22 NOTE — Therapy (Signed)
[facility] [address] [place], [place], [address] Phone: [phone]   Fax:  [phone]  Physical Therapy Treatment  Patient Details  Name: Randel Hargens MRN: [id number] Date of Birth: 12-20-1975 Referring Provider (PT): Cleophas Dunker   Encounter Date: 05/22/2021   PT End of Session - 05/22/21 0851     Visit Number 3    Number of Visits 13    Date for PT Re-Evaluation 06/22/21    Authorization Type UHC    PT Start Time 0850    PT Stop Time 0932    PT Time Calculation (min) 42 min    Activity Tolerance Patient tolerated treatment well    Behavior During Therapy [facility] for tasks assessed/performed             History reviewed. No pertinent past medical history.  History reviewed. No pertinent surgical history.  There were no vitals filed for this visit.   Subjective Assessment - 05/22/21 0852     Subjective i think the needling helped. It is a little bit better.    Patient Stated Goals squatting, bending, lifting pizzas- reaching fwd with Rt arm    Currently in Pain? No/denies                [facility] PT Assessment - 05/22/21 0001       AROM   Lumbar Flexion 50% down shin              Pt entered pool with use of unilat HR, depth up to 4'8", temp 95 deg.   AquaticREHABdocumentation: Pt.requires the viscosity of the water for resistance with strengthening exercises and Hydrostatic pressure also supports joints by unweighting joint load by at least 50 % in 3-4 feet depth water. 80% in chest to neck deep water.  Walking- all directions leg float HS stretch hip ext stretch with noodle under ankle standing leg circles Sraight arm press down with blue water dumbbells-prog to hip hinge with dumbbells pressed just under water plank float- hands on bench, belt under stomach Wide tandem trunk rotation- arms under water      [facility] Adult PT Treatment/Exercise - 05/22/21 0001       Manual Therapy   Manual  therapy comments skilled palpation and monitoring during TDN    Soft tissue mobilization lumbar paraspinals              Trigger Point Dry Needling - 05/22/21 0001     Other Dry Needling L5Lt paraspinals                     PT Short Term Goals - 05/07/21 1947       PT SHORT TERM GOAL #1   Title pt will demo proper hip hinge in standing    Baseline poor form at eval- utilizing motion via lumbar spine    Time 3    Period Weeks    Status New    Target Date 05/31/21               PT Long Term Goals - 05/07/21 1948       PT LONG TERM GOAL #1   Title pt will be I with long term HEP    Baseline will progress and establish as appropriate    Time 6    Period Weeks    Status New    Target Date 06/22/21      PT LONG TERM GOAL #2   Title bil hip  abd strength 5/5    Baseline see flowsheet    Time 6    Period Weeks    Status New    Target Date 06/22/21      PT LONG TERM GOAL #3   Title pt will be able to lift and navigate pizzas in and out of oven without back pain    Baseline pain notable at eval    Time 6    Period Weeks    Status New    Target Date 06/22/21      PT LONG TERM GOAL #4   Title pt will report resolution of pinching pain with ADLs    Baseline frequent at eval    Time 6    Period Weeks    Status New    Target Date 06/22/21                   Plan - 05/22/21 1202     Clinical Impression Statement twitch response in DN today which pt reported felt sore after as expected. began in pool today and pt liked the enironment for exercises. multiple cues provided for core engagement and to reduce lordosis in lumbar spine. will cont to challenge lumbopelvic strength and utilize DN PRN.    PT Treatment/Interventions ADLs/Self Care Home Management;Aquatic Therapy;Cryotherapy;Electrical Stimulation;Ultrasound;Traction;Moist Heat;Iontophoresis 4mg /ml Dexamethasone;Functional mobility training;Therapeutic activities;Therapeutic  exercise;Neuromuscular re-education;Manual techniques;Patient/family education;Passive range of motion;Dry needling;Taping    PT Next Visit Plan cont aquatics & DN PRN    PT Home Exercise Plan [id number]  come to upright posture every 45 min when at work    and Agree with Plan of Care Patient             Patient will benefit from skilled therapeutic intervention in order to improve the following deficits and impairments:  Increased muscle spasms, Decreased activity tolerance, Pain, Improper body mechanics, Impaired flexibility, Decreased strength, Postural dysfunction  Visit Diagnosis: Chronic midline low back pain without sciatica  Muscle spasm of back  Muscle weakness (generalized)     Problem List There are no problems to display for this patient.  Brizeida Mcmurry C. Desarea Ohagan PT, DPT 05/22/21 12:07 PM   [facility] [address] [place], [place], [place] Phone: [phone]   Fax:  [phone]  Name: Augustin Bun MRN: [name] Date of Birth: 1976-01-08

## 2021-05-25 ENCOUNTER — Ambulatory Visit (HOSPITAL_BASED_OUTPATIENT_CLINIC_OR_DEPARTMENT_OTHER): Payer: No Typology Code available for payment source | Admitting: Physical Therapy

## 2021-05-25 ENCOUNTER — Other Ambulatory Visit: Payer: Self-pay

## 2021-05-25 ENCOUNTER — Encounter (HOSPITAL_BASED_OUTPATIENT_CLINIC_OR_DEPARTMENT_OTHER): Payer: Self-pay | Admitting: Physical Therapy

## 2021-05-25 DIAGNOSIS — M6281 Muscle weakness (generalized): Secondary | ICD-10-CM

## 2021-05-25 DIAGNOSIS — M545 Low back pain, unspecified: Secondary | ICD-10-CM | POA: Diagnosis not present

## 2021-05-25 DIAGNOSIS — G8929 Other chronic pain: Secondary | ICD-10-CM

## 2021-05-25 DIAGNOSIS — M62830 Muscle spasm of back: Secondary | ICD-10-CM

## 2021-05-25 NOTE — Therapy (Signed)
[facility] [address] [place], [place], [address] Phone: [phone]   Fax:  [phone]  Physical Therapy Treatment  Patient Details  Name: Francis Alvarez MRN: [id number] Date of Birth: 1975-10-29 Referring Provider (PT): Cleophas Dunker   Encounter Date: 05/25/2021   PT End of Session - 05/25/21 1205     Visit Number 4    Number of Visits 13    Date for PT Re-Evaluation 06/22/21    Authorization Type UHC    PT Start Time 1204    PT Stop Time 1245    PT Time Calculation (min) 41 min    Activity Tolerance Patient tolerated treatment well    Behavior During Therapy [facility] for tasks assessed/performed             History reviewed. No pertinent past medical history.  History reviewed. No pertinent surgical history.  There were no vitals filed for this visit.   Subjective Assessment - 05/25/21 1428     Subjective 'Back is a little better after DN. Still feel that spot in my back "                                          PT Short Term Goals - 05/07/21 1947       PT SHORT TERM GOAL #1   Title pt will demo proper hip hinge in standing    Baseline poor form at eval- utilizing motion via lumbar spine    Time 3    Period Weeks    Status New    Target Date 05/31/21               PT Long Term Goals - 05/07/21 1948       PT LONG TERM GOAL #1   Title pt will be I with long term HEP    Baseline will progress and establish as appropriate    Time 6    Period Weeks    Status New    Target Date 06/22/21      PT LONG TERM GOAL #2   Title bil hip abd strength 5/5    Baseline see flowsheet    Time 6    Period Weeks    Status New    Target Date 06/22/21      PT LONG TERM GOAL #3   Title pt will be able to lift and navigate pizzas in and out of oven without back pain    Baseline pain notable at eval    Time 6    Period Weeks    Status New    Target Date 06/22/21      PT  LONG TERM GOAL #4   Title pt will report resolution of pinching pain with ADLs    Baseline frequent at eval    Time 6    Period Weeks    Status New    Target Date 06/22/21             Pt seen for aquatic therapy today.  Treatment took place in water 3.25-4.8 ft in depth at the [facility] pool. Temp of water was 91.  Pt entered/exited the pool via stairs step to pattern independently with bilat rail.  Warm up: forward, backward (posterior core activation) and side stepping/walking cues for increased step length, increased speed, hand placement to increase resistance.  Amb throughout session for  recovery/stretching after exercises 2 length. Holding 2 foam hand buoys submerged for increased core activation  Standing Stretching: -Runner stretch for hamstring and gastroc 5 x 30 second hold -add stretch 2 x 30 sec hold -pike positioning at bottom step holding to handrails for low and mid back stretch/erector spinae 5 x 30 second hold -right then left hip hiking in pike position for lateral core stretch 5 x 30 sec hold -hip flex stretch using noodle, pt facing wall R/L 3 x 30 sec hold Strengthening -hip flex pulling knee to wall resisted by noodle x 10 -hip add/abd 2 x 10  In 4.6 ft Using 2 foam hand buoys and ankle buoys: Standing<>sup holding x 10 seconds x 7 reps. VC and TC for proper execution Standing<>prone holding x 10 sec x 7 rep Standing: knees to chest x10 Supine suspension knees to chest 2 x 10 Pendulum (lateral core engagement) x 5. Pt with difficulty with skill due to fatigue.  Pt requires buoyancy for support and to offload joints with strengthening exercises. Viscosity of the water is needed for resistance of strengthening; water current perturbations provides challenge to standing balance unsupported, requiring increased core activation.           Plan - 05/25/21 1407     Clinical Impression Statement Initiated treatment with stretching of entire  core and LE to assure adequte muscle length.  Focus on core strengthening.  Pt given vc, tc and demonstration throughout for proper execution. Instruction with each exercise for pt to focus on area being stretched and/or exercised.  He responded very well and is focused on overall wellness improvement as well as recovery from this episode.    Stability/Clinical Decision Making Stable/Uncomplicated    Clinical Decision Making Low    Rehab Potential Good    PT Frequency 2x / week    PT Duration 6 weeks    PT Treatment/Interventions ADLs/Self Care Home Management;Aquatic Therapy;Cryotherapy;Electrical Stimulation;Ultrasound;Traction;Moist Heat;Iontophoresis 4mg /ml Dexamethasone;Functional mobility training;Therapeutic activities;Therapeutic exercise;Neuromuscular re-education;Manual techniques;Patient/family education;Passive range of motion;Dry needling;Taping    PT Next Visit Plan add to HEP/advance core strengtehing             Patient will benefit from skilled therapeutic intervention in order to improve the following deficits and impairments:  Increased muscle spasms, Decreased activity tolerance, Pain, Improper body mechanics, Impaired flexibility, Decreased strength, Postural dysfunction  Visit Diagnosis: Chronic midline low back pain without sciatica  Muscle spasm of back  Muscle weakness (generalized)     Problem List There are no problems to display for this patient.   [address] [place]) Yahira Timberman MPT   05/25/2021, 2:29 PM  [facility] [address] [place], [place], [place] Phone: [phone]   Fax:  [phone]  Name: Julies Carmickle MRN: [name] Date of Birth: 1975/12/23

## 2021-05-29 ENCOUNTER — Other Ambulatory Visit: Payer: Self-pay

## 2021-05-29 ENCOUNTER — Ambulatory Visit (HOSPITAL_BASED_OUTPATIENT_CLINIC_OR_DEPARTMENT_OTHER): Payer: No Typology Code available for payment source | Attending: Sports Medicine | Admitting: Physical Therapy

## 2021-05-29 DIAGNOSIS — M6281 Muscle weakness (generalized): Secondary | ICD-10-CM | POA: Diagnosis present

## 2021-05-29 DIAGNOSIS — M545 Low back pain, unspecified: Secondary | ICD-10-CM | POA: Diagnosis not present

## 2021-05-29 DIAGNOSIS — R2689 Other abnormalities of gait and mobility: Secondary | ICD-10-CM | POA: Diagnosis present

## 2021-05-29 DIAGNOSIS — M62830 Muscle spasm of back: Secondary | ICD-10-CM | POA: Insufficient documentation

## 2021-05-29 DIAGNOSIS — R293 Abnormal posture: Secondary | ICD-10-CM | POA: Diagnosis present

## 2021-05-29 DIAGNOSIS — M25561 Pain in right knee: Secondary | ICD-10-CM | POA: Diagnosis present

## 2021-05-29 DIAGNOSIS — G8929 Other chronic pain: Secondary | ICD-10-CM | POA: Insufficient documentation

## 2021-05-29 DIAGNOSIS — R262 Difficulty in walking, not elsewhere classified: Secondary | ICD-10-CM | POA: Diagnosis present

## 2021-05-29 DIAGNOSIS — R2681 Unsteadiness on feet: Secondary | ICD-10-CM | POA: Diagnosis present

## 2021-05-29 NOTE — Therapy (Signed)
[facility] [address] [place], [place], [address] Phone: [phone]   Fax:  [phone]  Physical Therapy Treatment  Patient Details  Name: Francis Alvarez MRN: [id number] Date of Birth: Nov 01, 1975 Referring Provider (PT): Cleophas Dunker   Encounter Date: 05/29/2021   PT End of Session - 05/29/21 0906     Visit Number 5    Number of Visits 13    Date for PT Re-Evaluation 06/22/21    Authorization Type UHC    PT Start Time 0900    PT Stop Time 0945    PT Time Calculation (min) 45 min    Equipment Utilized During Treatment Other (comment)   Ankle cuff and waist buoys, kick board, noodle/sqoodle, nekdoodle, weights and barbells, water walker   Activity Tolerance Patient tolerated treatment well    Behavior During Therapy [facility] for tasks assessed/performed             No past medical history on file.  No past surgical history on file.  There were no vitals filed for this visit.   Subjective Assessment - 05/29/21 1258     Subjective "Back is a little better than last time, no other muscle soarness from last session"    Currently in Pain? Yes    Pain Score 2     Pain Location Back    Pain Orientation Right;Lower    Pain Descriptors / Indicators Tightness    Pain Type Acute pain    Pain Onset 1 to 4 weeks ago    Pain Frequency Intermittent                                        PT Education - 05/29/21 1300     Education Details aquatics, properties of water, benefits of    Person(s) Educated Patient    Methods Explanation    Comprehension Verbalized understanding              PT Short Term Goals - 05/07/21 1947       PT SHORT TERM GOAL #1   Title pt will demo proper hip hinge in standing    Baseline poor form at eval- utilizing motion via lumbar spine    Time 3    Period Weeks    Status New    Target Date 05/31/21               PT Long Term Goals - 05/07/21 1948        PT LONG TERM GOAL #1   Title pt will be I with long term HEP    Baseline will progress and establish as appropriate    Time 6    Period Weeks    Status New    Target Date 06/22/21      PT LONG TERM GOAL #2   Title bil hip abd strength 5/5    Baseline see flowsheet    Time 6    Period Weeks    Status New    Target Date 06/22/21      PT LONG TERM GOAL #3   Title pt will be able to lift and navigate pizzas in and out of oven without back pain    Baseline pain notable at eval    Time 6    Period Weeks    Status New    Target Date 06/22/21  PT LONG TERM GOAL #4   Title pt will report resolution of pinching pain with ADLs    Baseline frequent at eval    Time 6    Period Weeks    Status New    Target Date 06/22/21           Pt seen for aquatic therapy today.  Treatment took place in water 3.25-4.8 ft in depth at the [facility] pool. Temp of water was 91.  Pt entered/exited the pool via stairs step to pattern independently with bilat rail.   Warm up: forward, backward (posterior core activation) and side stepping/walking cues for increased step length, increased speed, hand placement to increase resistance.   Amb throughout session for recovery/stretching after exercises 2 length. Holding 2 foam hand buoys submerged for increased core activation   Standing Stretching: -Runner stretch for hamstring and gastroc 3 x 30 second hold -pike positioning at bottom step holding to handrails for low and mid back stretch/erector spinae 3 x 30 second hold. Pt also completes 2 more more trials inbetween exercises to ensure muscle length. -right then left hip hiking in pike position for lateral core stretch 3 x 30 sec hold  Strengthening -Hip flex 2 x 10 -hip add/abd 2 x 10 -hip ext 2 x 10 - 4 widths water walking between each exercise -Modified Ai Chi #13 2 x 5 R/L Pt with difficulty initially. Second set pt completes without LOB and improved core control.  VC  for tightened abdominals and opposite glute. -kick board lateral rotations 12 reps  VC and demo for proper execution   In 4.6 ft Using 2 foam hand buoys and ankle buoys: Standing<>sup holding x 10 seconds x 7 reps. VC proper execution Standing<>prone holding x 10 sec x 5 rep Standing: knees to chest 2 x10 Supine suspension knees to chest 2 x 10    Pt requires buoyancy for support and to offload joints with strengthening exercises. Viscosity of the water is needed for resistance of strengthening; water current perturbations provides challenge to standing balance unsupported, requiring increased core activation.         Plan - 05/29/21 1301     Clinical Impression Statement Pt reports he is feeling better after last session.  Focus on advancing core muscle stretgth after adequate stretching. EDU and demonstration on proper body mechanics lifting objects (pizza) and leaning. Pt eager to lean and is vested in improving his posture/strength and furthering his overall wellness.    Stability/Clinical Decision Making Stable/Uncomplicated    Clinical Decision Making Low    Rehab Potential Good    PT Frequency 2x / week    PT Duration 6 weeks    PT Treatment/Interventions ADLs/Self Care Home Management;Aquatic Therapy;Cryotherapy;Electrical Stimulation;Ultrasound;Traction;Moist Heat;Iontophoresis 4mg /ml Dexamethasone;Functional mobility training;Therapeutic activities;Therapeutic exercise;Neuromuscular re-education;Manual techniques;Patient/family education;Passive range of motion;Dry needling;Taping    PT Next Visit Plan add to HEP/advance core strengtehing; hip hinges    PT Home Exercise Plan [id number]  come to upright posture every 45 min when at work. 05/29/21 Added aquatics.             Patient will benefit from skilled therapeutic intervention in order to improve the following deficits and impairments:  Increased muscle spasms, Decreased activity tolerance, Pain, Improper body  mechanics, Impaired flexibility, Decreased strength, Postural dysfunction  Visit Diagnosis: Chronic midline low back pain without sciatica  Muscle spasm of back  Muscle weakness (generalized)     Problem List There are no problems to display for this patient.  Francis Alvarez MPT   05/29/2021, 1:12 PM  [facility] [address] [place], [place], [address] Phone: [phone]   Fax:  [phone]  Name: Markian Glockner MRN: [id number] Date of Birth: [birth year]

## 2021-06-01 ENCOUNTER — Ambulatory Visit (HOSPITAL_BASED_OUTPATIENT_CLINIC_OR_DEPARTMENT_OTHER): Payer: No Typology Code available for payment source | Admitting: Physical Therapy

## 2021-06-01 ENCOUNTER — Other Ambulatory Visit: Payer: Self-pay

## 2021-06-01 DIAGNOSIS — G8929 Other chronic pain: Secondary | ICD-10-CM

## 2021-06-01 DIAGNOSIS — M545 Low back pain, unspecified: Secondary | ICD-10-CM

## 2021-06-01 DIAGNOSIS — M62830 Muscle spasm of back: Secondary | ICD-10-CM

## 2021-06-01 DIAGNOSIS — M6281 Muscle weakness (generalized): Secondary | ICD-10-CM

## 2021-06-01 NOTE — Therapy (Signed)
[facility] [address] [place], [place], [address] Phone: [phone]   Fax:  [phone]  Physical Therapy Treatment  Patient Details  Name: Francis Alvarez MRN: [id number] Date of Birth: 02-Apr-1976 Referring Provider (PT): Cleophas Dunker   Encounter Date: 06/01/2021   PT End of Session - 06/01/21 0912     Visit Number 6    Number of Visits 13    Date for PT Re-Evaluation 06/22/21    PT Start Time 0901    PT Stop Time 0945    PT Time Calculation (min) 44 min    Equipment Utilized During Treatment Other (comment)    Activity Tolerance Patient tolerated treatment well    Behavior During Therapy [facility] for tasks assessed/performed             No past medical history on file.  No past surgical history on file.  There were no vitals filed for this visit.   Subjective Assessment - 06/01/21 0919     Subjective " I worked 3 full days (12+hours) and my back didn't start hurting me until the end of the 3rd day"                                          PT Short Term Goals - 05/07/21 1947       PT SHORT TERM GOAL #1   Title pt will demo proper hip hinge in standing    Baseline poor form at eval- utilizing motion via lumbar spine    Time 3    Period Weeks    Status New    Target Date 05/31/21               PT Long Term Goals - 05/07/21 1948       PT LONG TERM GOAL #1   Title pt will be I with long term HEP    Baseline will progress and establish as appropriate    Time 6    Period Weeks    Status New    Target Date 06/22/21      PT LONG TERM GOAL #2   Title bil hip abd strength 5/5    Baseline see flowsheet    Time 6    Period Weeks    Status New    Target Date 06/22/21      PT LONG TERM GOAL #3   Title 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  Treatment took place in water 3.25-4.8 ft in depth at the [facility] pool. Temp of water was 94.  Pt entered/exited the pool via stairs step to pattern independently with bilat rail.   Warm up: forward, backward (posterior core activation) and side stepping/walking cues for increased step length, increased speed, hand placement to  increase resistance.   Amb throughout session for recovery/stretching after most exercises 2 length.    Standing Stretching: -Runner stretch for hamstring and gastroc 3 x 30 second hold -pike positioning at bottom step holding to handrails for low and mid back stretch/erector spinae 3 x 30 second hold.  -right then left hip hiking in pike position for lateral core stretch 3 x 30 sec hold  Pt completes stretching again at end of session for cool down.   Strengthening -Hip flex 2 x 10 -hip add/abd 2 x 10 -hip ext 2 x 10 -kick board lateral rotations 2x15 reps  VC and demo for proper execution.  Pt adding manual perturbations.     In 4.6 ft Using 2 foam hand buoys and ankle buoys: Standing<>sup holding x 10 seconds 2 x5 reps. VC proper execution Standing<>prone holding x 10 sec 2 x 5 rep Standing: knees to chest 3 x10 Supine suspension knees to chest 2 x 10     Pt requires buoyancy for support and to offload joints with strengthening exercises. Viscosity of the water is needed for resistance of strengthening; water current perturbations provides challenge to standing balance unsupported, requiring increased core activation.         Plan - 06/01/21 0920     Clinical Impression Statement Pt with improved ability to tolerate work with less discomfort.  Focus remains on LB and le stretching/core  strengthening.  He demonstrates improvement on core control with hand buoy knees to chest, tolerated increased sets today.    Personal Factors and Comorbidities Fitness;Profession    Examination-Activity Limitations Bend;Sit;Carry;Squat;Stand;Lift    Stability/Clinical Decision Making Stable/Uncomplicated    Clinical Decision Making Low    Rehab Potential Good    PT Frequency 2x / week    PT Duration 6 weeks    PT Treatment/Interventions ADLs/Self Care Home Management;Aquatic Therapy;Cryotherapy;Electrical Stimulation;Ultrasound;Traction;Moist Heat;Iontophoresis 4mg /ml Dexamethasone;Functional mobility training;Therapeutic activities;Therapeutic exercise;Neuromuscular re-education;Manual techniques;Patient/family education;Passive range of motion;Dry needling;Taping    PT Home Exercise Plan [id number]  come to upright posture every 45 min when at work. 05/29/21 Added aquatics.             Patient will benefit from skilled therapeutic intervention in order to improve the following deficits and impairments:  Increased muscle spasms, Decreased activity tolerance, Pain, Improper body mechanics, Impaired flexibility, Decreased strength, Postural dysfunction  Visit Diagnosis: Chronic midline low back pain without sciatica  Muscle spasm of back  Muscle weakness (generalized)     Problem List There are no problems to display for this patient.   07/29/21 Corrie Dandy) Joanie Duprey MPT 06/01/2021, 11:43 AM  [facility] [address] [place], [place], [place] Phone: [phone]   Fax:  [phone]  Name: Robie Mcniel MRN: [name] Date of Birth: May 19, 1976

## 2021-06-05 ENCOUNTER — Ambulatory Visit (HOSPITAL_BASED_OUTPATIENT_CLINIC_OR_DEPARTMENT_OTHER): Payer: No Typology Code available for payment source | Admitting: Physical Therapy

## 2021-06-05 ENCOUNTER — Other Ambulatory Visit: Payer: Self-pay

## 2021-06-05 ENCOUNTER — Encounter (HOSPITAL_BASED_OUTPATIENT_CLINIC_OR_DEPARTMENT_OTHER): Payer: Self-pay | Admitting: Physical Therapy

## 2021-06-05 DIAGNOSIS — M545 Low back pain, unspecified: Secondary | ICD-10-CM | POA: Diagnosis not present

## 2021-06-05 DIAGNOSIS — G8929 Other chronic pain: Secondary | ICD-10-CM

## 2021-06-05 DIAGNOSIS — M6281 Muscle weakness (generalized): Secondary | ICD-10-CM

## 2021-06-05 DIAGNOSIS — M62830 Muscle spasm of back: Secondary | ICD-10-CM

## 2021-06-05 NOTE — Therapy (Signed)
[facility] [address] [place], [place], [address] Phone: [phone]   Fax:  [phone]  Physical Therapy Treatment  Patient Details  Name: Artur Winningham MRN: [id number] Date of Birth: 1976/08/25 Referring Provider (PT): Cleophas Dunker   Encounter Date: 06/05/2021   PT End of Session - 06/05/21 0959     Visit Number 7    Number of Visits 13    Date for PT Re-Evaluation 06/22/21    PT Start Time 0946    PT Stop Time 1030    PT Time Calculation (min) 44 min    Equipment Utilized During Treatment Other (comment)    Activity Tolerance Patient tolerated treatment well    Behavior During Therapy [facility] for tasks assessed/performed             History reviewed. No pertinent past medical history.  History reviewed. No pertinent surgical history.  There were no vitals filed for this visit.   Subjective Assessment - 06/05/21 1102     Subjective "Some discomfort in same area but I think it is better.  Have been able to work through weekend without increased pain"    Currently in Pain? 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  Treatment took place in water 3.25-4.8 ft in depth at the [facility] pool. Temp of water was 94.  Pt  entered/exited the pool via stairs step to pattern independently with bilat rail.   Warm up: forward, backward (posterior core activation) and side stepping/walking cues for increased step length, increased speed, hand placement to increase resistance.   Amb throughout session for recovery/stretching after most exercises 2 length.    Standing Stretching: -Runner stretch for hamstring and gastroc 3 x 30 second hold -pike positioning at bottom step holding to handrails for low and mid back stretch/erector spinae 3 x 30 second hold.  -right then left hip hiking in pike position for lateral core stretch 3 x 30 sec hold   Pt completes stretching again at end of session for cool down.   Strengthening Using water band: -Hip flex 2 x 10 -hip add/abd 2 x 10 -hip ext 2 x 10     In 4.6 ft Using 2 foam hand buoys and ankle buoys: Standing<>sup holding x 10 seconds 2 x8reps. VC proper execution Standing<>prone holding x 10 sec 2 x 8 rep Standing: knees to chest 3 x10 Supine suspension knees to chest 3 x 10     Pt requires buoyancy for support and to offload joints with strengthening exercises. Viscosity of the water is needed for resistance of strengthening; water current perturbations provides challenge to standing balance unsupported, requiring increased core activation.        Plan - 06/05/21 1004     Clinical Impression Statement Pt tolerates  increased intensity of theraputic exercise today with the addition of new resisited hip and core exercises using aqua band and increased reps of already established core exercises. 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  He reports he has been able to complete better with less pain over weekend    Stability/Clinical Decision Making Stable/Uncomplicated    Clinical Decision Making Low    Rehab Potential Good    PT Frequency 2x / week    PT Duration 6 weeks    PT Treatment/Interventions ADLs/Self Care Home Management;Aquatic Therapy;Cryotherapy;Electrical Stimulation;Ultrasound;Traction;Moist Heat;Iontophoresis 4mg /ml Dexamethasone;Functional mobility training;Therapeutic activities;Therapeutic exercise;Neuromuscular re-education;Manual techniques;Patient/family education;Passive range of motion;Dry needling;Taping             Patient will benefit from skilled therapeutic intervention in order to improve the following deficits and impairments:  Increased muscle spasms, Decreased activity tolerance, Pain, Improper body mechanics, Impaired flexibility, Decreased strength, Postural dysfunction  Visit Diagnosis: Chronic midline low back pain without sciatica  Muscle spasm of back  Muscle weakness (generalized)     Problem List There are no problems to display for this patient.   Corrie Dandy) Janiqua Friscia MPT 06/05/2021, 11:13 AM  [facility] [address] [place], [place], [place] Phone: [phone]   Fax:  [phone]  Name: Treyvion Durkee MRN: [name] Date of Birth: 30-Jun-1976

## 2021-06-08 ENCOUNTER — Ambulatory Visit (HOSPITAL_BASED_OUTPATIENT_CLINIC_OR_DEPARTMENT_OTHER): Payer: No Typology Code available for payment source | Admitting: Physical Therapy

## 2021-06-08 ENCOUNTER — Other Ambulatory Visit: Payer: Self-pay

## 2021-06-08 ENCOUNTER — Encounter (HOSPITAL_BASED_OUTPATIENT_CLINIC_OR_DEPARTMENT_OTHER): Payer: Self-pay | Admitting: Physical Therapy

## 2021-06-08 DIAGNOSIS — R2681 Unsteadiness on feet: Secondary | ICD-10-CM

## 2021-06-08 DIAGNOSIS — M6281 Muscle weakness (generalized): Secondary | ICD-10-CM

## 2021-06-08 DIAGNOSIS — R2689 Other abnormalities of gait and mobility: Secondary | ICD-10-CM

## 2021-06-08 DIAGNOSIS — G8929 Other chronic pain: Secondary | ICD-10-CM

## 2021-06-08 DIAGNOSIS — R262 Difficulty in walking, not elsewhere classified: Secondary | ICD-10-CM

## 2021-06-08 DIAGNOSIS — R293 Abnormal posture: Secondary | ICD-10-CM

## 2021-06-08 DIAGNOSIS — M545 Low back pain, unspecified: Secondary | ICD-10-CM | POA: Diagnosis not present

## 2021-06-08 DIAGNOSIS — M25561 Pain in right knee: Secondary | ICD-10-CM

## 2021-06-08 NOTE — Therapy (Signed)
[facility] [address] [place], [place], [address] Phone: [phone]   Fax:  [phone]  Physical Therapy Treatment  Patient Details  Name: Francis Alvarez MRN: [id number] Date of Birth: February 07, 1976 Referring Provider (PT): Cleophas Dunker   Encounter Date: 06/08/2021   PT End of Session - 06/08/21 0914     Visit Number 8    Number of Visits 13    Date for PT Re-Evaluation 06/22/21    PT Start Time 0906    PT Stop Time 0946    PT Time Calculation (min) 40 min    Equipment Utilized During Treatment Other (comment)    Activity Tolerance Patient tolerated treatment well    Behavior During Therapy [facility] for tasks assessed/performed             History reviewed. No pertinent past medical history.  History reviewed. No pertinent surgical history.  There were no vitals filed for this visit.                                 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  Treatment took place in water 3.25-4.8 ft  in depth at the [facility] pool. Temp of water was 94.  Pt entered/exited the pool via stairs step to pattern independently with bilat rail.   Warm up: forward, backward (posterior core activation) and side stepping/walking cues for increased step length, increased speed, hand placement to increase resistance.    Standing Stretching: -Runner stretch for hamstring and gastroc 3 x 30 second hold -pike positioning at bottom step holding to handrails for low and mid back stretch/erector spinae 3 x 30 second hold.  -right then left hip hiking in pike position for lateral core stretch 3 x 30 sec hold    In 4.6 ft Using 2 foam hand buoys: Standing<>sup holding x 15 seconds 5 reps. VC proper execution  Standing<>prone holding x 10 sec 2 x 8 rep Standing: knees to chest 3 x10 Supine suspension knees to chest 3 x 10 Added: sup<>prone pendulum. Multiple trials to gain and understand movement. Completes 5 revolutions Began lateral pendulum: able to gain good lateral core stretching     Pt requires buoyancy for support and to offload joints with strengthening exercises. Viscosity of the water is needed for resistance of strengthening; water current perturbations provides challenge to standing balance unsupported, requiring increased core activation.       Plan - 06/08/21 0933     Clinical Impression Statement Focus on core.  Pt demonstrates improved erector spinae strength as evidenced by gaining suspended prone position using hand buoys indep and maintaining. Able to advance to more challenging exercises.    Stability/Clinical Decision Making Stable/Uncomplicated    Clinical Decision Making Low    Rehab Potential Good    PT Frequency 2x / week    PT Treatment/Interventions ADLs/Self Care Home Management;Aquatic Therapy;Cryotherapy;Electrical Stimulation;Ultrasound;Traction;Moist Heat;Iontophoresis 4mg /ml Dexamethasone;Functional mobility training;Therapeutic activities;Therapeutic  exercise;Neuromuscular re-education;Manual techniques;Patient/family education;Passive range of motion;Dry needling;Taping    PT Next Visit Plan add to HEP/advance core strengtehing; hip hinges    PT Home Exercise Plan [id number]  come to upright posture every 45 min when at work. 05/29/21 Added aquatics.             Patient will benefit from skilled therapeutic intervention in order to improve the following deficits and impairments:  Increased muscle spasms, Decreased activity tolerance, Pain, Improper body mechanics, Impaired flexibility, Decreased strength, Postural dysfunction  Visit Diagnosis: Abnormal posture  Chronic pain of right knee  Difficulty in walking, not elsewhere classified  Muscle weakness (generalized)  Unsteadiness on feet  Other abnormalities of gait and mobility     Problem List There are no problems to display for this patient.   07/29/21 Corrie Dandy) Lehman Whiteley MPT 06/08/2021, 1:33 PM  [facility] [address] [place], [place], [place] Phone: [phone]   Fax:  [phone]  Name: Francis Alvarez MRN: [name] Date of Birth: 1976-01-11

## 2021-06-11 ENCOUNTER — Encounter (HOSPITAL_BASED_OUTPATIENT_CLINIC_OR_DEPARTMENT_OTHER): Payer: Self-pay | Admitting: Physical Therapy

## 2021-06-11 ENCOUNTER — Ambulatory Visit (HOSPITAL_BASED_OUTPATIENT_CLINIC_OR_DEPARTMENT_OTHER): Payer: No Typology Code available for payment source | Admitting: Physical Therapy

## 2021-06-11 ENCOUNTER — Other Ambulatory Visit: Payer: Self-pay

## 2021-06-11 DIAGNOSIS — R2689 Other abnormalities of gait and mobility: Secondary | ICD-10-CM

## 2021-06-11 DIAGNOSIS — R262 Difficulty in walking, not elsewhere classified: Secondary | ICD-10-CM

## 2021-06-11 DIAGNOSIS — M545 Low back pain, unspecified: Secondary | ICD-10-CM | POA: Diagnosis not present

## 2021-06-11 DIAGNOSIS — R2681 Unsteadiness on feet: Secondary | ICD-10-CM

## 2021-06-11 DIAGNOSIS — M6281 Muscle weakness (generalized): Secondary | ICD-10-CM

## 2021-06-11 DIAGNOSIS — M25561 Pain in right knee: Secondary | ICD-10-CM

## 2021-06-11 DIAGNOSIS — R293 Abnormal posture: Secondary | ICD-10-CM

## 2021-06-11 DIAGNOSIS — G8929 Other chronic pain: Secondary | ICD-10-CM

## 2021-06-11 NOTE — Therapy (Signed)
[facility] [address] [place], [place], [address] Phone: [phone]   Fax:  [phone]  Physical Therapy Treatment  Patient Details  Name: Francis Alvarez MRN: [id number] Date of Birth: Feb 09, 1976 Referring Provider (PT): Cleophas Dunker   Encounter Date: 06/11/2021   PT End of Session - 06/11/21 1138     Visit Number 9    Number of Visits 13    Date for PT Re-Evaluation 06/22/21    PT Start Time 1135    PT Stop Time 1220    PT Time Calculation (min) 45 min    Equipment Utilized During Treatment Other (comment)    Activity Tolerance Patient tolerated treatment well    Behavior During Therapy [facility] for tasks assessed/performed             History reviewed. No pertinent past medical history.  History reviewed. No pertinent surgical history.  There were no vitals filed for this visit.   Subjective Assessment - 06/11/21 1138     Subjective "Little bit of pain on and off""    Pain Score 1     Pain Orientation Right;Lower    Pain Descriptors / Indicators Tightness    Pain Onset 1 to 4 weeks ago    Pain Frequency Intermittent                                          PT Short Term Goals - 06/05/21 1111       PT SHORT TERM GOAL #1   Status Achieved               PT Long Term Goals - 05/07/21 1948       PT LONG TERM GOAL #1   Title pt will be I with long term HEP    Baseline will progress and establish as appropriate    Time 6    Period Weeks    Status New    Target Date 06/22/21      PT LONG TERM GOAL #2   Title bil hip abd strength 5/5    Baseline see flowsheet    Time 6    Period Weeks    Status New    Target Date 06/22/21      PT LONG TERM GOAL #3   Title 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  Treatment took place in water 3.25-4.8 ft in depth at the [facility] pool. Temp of water was 94.  Pt entered/exited the pool via stairs step to pattern independently with bilat rail.   Warm up: forward, backward (posterior core activation) and side stepping/walking cues for increased step length, increased speed, hand placement to increase resistance.     Standing Stretching: -Runner stretch for hamstring and gastroc 3 x 30  second hold -pike positioning at bottom step holding to handrails for low and mid back stretch/erector spinae 3 x 30 second hold.  -right then left hip hiking in pike position for lateral core stretch 3 x 30 sec hold  Standing Kick board rotation 2 x  15 R/L     In 4.6 ft Using 2.5 foam hand buoys: Standing<>sup holding x 15 seconds 3 reps. Standing<>prone holding x 15 sec 5 rep Added prone flys 3 x 10           -prone hold with buoys submerged at chest level. Pt with multiple tries best hold with control 3-4 seconds Standing: knees to chest 3 x10 Supine suspension knees to chest 2 x 10 completed with ankle buoys Added knees to R then left for rotator strengthening x 10 sup<>prone pendulum. Pt with improved execution 2 x 10 revolutions Added ankle buoys x 10 reps for increased resistance. lateral pendulum: vc and demonstration. Held x 10 seconds R/L for stretch then 2 x 10 rotations for strengthening.  VC for focus on proper muscle engagement.  Balance: tall kneeling on kick board. Cues for proper execution.  Pt only able to hold with ue "swimming"     Pt requires buoyancy for support and to offload joints with strengthening exercises. Viscosity of the water is needed for resistance of strengthening; water current perturbations provides  challenge to standing balance unsupported, requiring increased core activation.        Plan - 06/11/21 1229     Clinical Impression Statement Pt continues to demonstarte improvement in core strength, lumbopelvic control and reporting decreased tightness in LB and LE's.  As noted in objective, pt has tolerated addition of greater challenge core exercises as well as addition of le ankle buoys for added resistance.  He is tolerating his 12 hour shifts at work with occassional minor discomfort.    Personal Factors and Comorbidities Fitness;Profession    Examination-Activity Limitations Bend;Sit;Carry;Squat;Stand;Lift    Stability/Clinical Decision Making Stable/Uncomplicated    Clinical Decision Making Low    Rehab Potential Good    PT Frequency 2x / week    PT Duration 6 weeks    PT Treatment/Interventions ADLs/Self Care Home Management;Aquatic Therapy;Cryotherapy;Electrical Stimulation;Ultrasound;Traction;Moist Heat;Iontophoresis 4mg /ml Dexamethasone;Functional mobility training;Therapeutic activities;Therapeutic exercise;Neuromuscular re-education;Manual techniques;Patient/family education;Passive range of motion;Dry needling;Taping    PT Next Visit Plan add to HEP/advance core strengtehing; hip hinges             Patient will benefit from skilled therapeutic intervention in order to improve the following deficits and impairments:  Increased muscle spasms, Decreased activity tolerance, Pain, Improper body mechanics, Impaired flexibility, Decreased strength, Postural dysfunction  Visit Diagnosis: Abnormal posture  Chronic pain of right knee  Difficulty in walking, not elsewhere classified  Muscle weakness (generalized)  Unsteadiness on feet  Other abnormalities of gait and mobility     Problem List There are no problems to display for this patient.   Corrie Dandy) Lindwood Mogel MPT 06/11/2021, 12:36 PM  [facility] [address] [place], [place], [place] Phone: [phone]   Fax:  [phone]  Name: Francis Alvarez MRN: [name] Date of Birth: [birth year]

## 2021-06-15 ENCOUNTER — Other Ambulatory Visit: Payer: Self-pay

## 2021-06-15 ENCOUNTER — Encounter (HOSPITAL_BASED_OUTPATIENT_CLINIC_OR_DEPARTMENT_OTHER): Payer: Self-pay | Admitting: Physical Therapy

## 2021-06-15 ENCOUNTER — Ambulatory Visit (HOSPITAL_BASED_OUTPATIENT_CLINIC_OR_DEPARTMENT_OTHER): Payer: No Typology Code available for payment source | Admitting: Physical Therapy

## 2021-06-15 DIAGNOSIS — R2689 Other abnormalities of gait and mobility: Secondary | ICD-10-CM

## 2021-06-15 DIAGNOSIS — M6281 Muscle weakness (generalized): Secondary | ICD-10-CM

## 2021-06-15 DIAGNOSIS — R262 Difficulty in walking, not elsewhere classified: Secondary | ICD-10-CM

## 2021-06-15 DIAGNOSIS — R293 Abnormal posture: Secondary | ICD-10-CM

## 2021-06-15 DIAGNOSIS — G8929 Other chronic pain: Secondary | ICD-10-CM

## 2021-06-15 DIAGNOSIS — R2681 Unsteadiness on feet: Secondary | ICD-10-CM

## 2021-06-15 DIAGNOSIS — M545 Low back pain, unspecified: Secondary | ICD-10-CM | POA: Diagnosis not present

## 2021-06-15 DIAGNOSIS — M25561 Pain in right knee: Secondary | ICD-10-CM

## 2021-06-15 NOTE — Therapy (Signed)
[facility] [address] [place], [place], [address] Phone: [phone]   Fax:  [phone]  Physical Therapy Treatment  Patient Details  Name: Francis Alvarez MRN: [id number] Date of Birth: 1975/09/18 Referring Provider (PT): Cleophas Dunker   Encounter Date: 06/15/2021   PT End of Session - 06/15/21 0953     Visit Number 10    Number of Visits 13    Date for PT Re-Evaluation 06/22/21    PT Start Time 0948    PT Stop Time 1033    PT Time Calculation (min) 45 min    Equipment Utilized During Treatment Other (comment)    Activity Tolerance Patient tolerated treatment well    Behavior During Therapy [facility] for tasks assessed/performed             History reviewed. No pertinent past medical history.  History reviewed. No pertinent surgical history.  There were no vitals filed for this visit.   Subjective Assessment - 06/15/21 0954     Subjective Pain is better.  Worked all day yeaterday without pain"    Currently in Pain? 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  Treatment took place in water 3.25-4.8 ft in depth at the [facility] pool. Temp of water was 94.  Pt entered/exited the pool via stairs step to pattern independently with bilat rail.   Warm up: forward, backward (posterior core activation) and side stepping/walking cues for increased step length, increased speed, hand placement to increase resistance.     Standing Stretching: -Runner stretch for hamstring and gastroc 3 x 30 second hold -pike positioning at bottom step holding to handrails for low and mid back stretch/erector spinae 3 x 30 second hold.  -  right then left hip hiking in pike position for lateral core stretch 3 x 30 sec hold   Standing Kick board rotation 2 x  15 R/L.  Board completely submerged     In 4.6 ft Using 2.5 foam hand buoys: Standing<>sup holding x 15 seconds 2 reps. Standing<>prone holding x 15 sec 2 rep  prone flys 3 x 10 using 2 foam buoys           -prone hold with buoys submerged at chest level using 2.5 foam hand buoys challenge to hold "tight core" 10-15 sec.  Multiple tries Supine suspension knees to chest x 10 completed - knees to R then left for rotator strengthening x 10 sup<>prone pendulum. Pt with improved execution x 10 then x6 revolutions with ankle buoys. (Pt fatigued) lateral pendulum: vc and demonstration. Held x 10 seconds R/L for stretch then 2 x 10 rotations for strengthening.  VC for focus on proper muscle engagement.       Pt requires buoyancy for support and to offload joints with strengthening exercises. Viscosity of the water is needed for resistance of strengthening; water current perturbations provides challenge to standing balance unsupported, requiring increased core activation.       Plan - 06/15/21 1003     Clinical Impression Statement Beginning to prepare for DC.  Increased resistance with core  exercises submerging kick board with rotations and ankle buoys as tolerated.  Pt with intermittent tightness vs pain.  Reports he feels stronger with intention to continue with HEP/exercising. Did ftigue toward ends of session.  Program wil be laminated and reviewed next visit.    Stability/Clinical Decision Making Stable/Uncomplicated    Clinical Decision Making Low    Rehab Potential Good    PT Frequency 2x / week    PT Duration 6 weeks    PT Treatment/Interventions ADLs/Self Care Home Management;Aquatic Therapy;Cryotherapy;Electrical Stimulation;Ultrasound;Traction;Moist Heat;Iontophoresis 4mg /ml Dexamethasone;Functional mobility training;Therapeutic activities;Therapeutic exercise;Neuromuscular re-education;Manual techniques;Patient/family education;Passive range of motion;Dry needling;Taping    PT Next Visit Plan HEP    PT Home Exercise Plan [id number]  come to upright posture every 45 min when at work. 05/29/21 Added aquatics.             Patient will benefit from skilled therapeutic intervention in order to improve the following deficits and impairments:  Increased muscle spasms, Decreased activity tolerance, Pain, Improper body mechanics, Impaired flexibility, Decreased strength, Postural dysfunction  Visit Diagnosis: Abnormal posture  Chronic pain of right knee  Difficulty in walking, not elsewhere classified  Muscle weakness (generalized)  Unsteadiness on feet  Other abnormalities of gait and mobility     Problem List There are no problems to display for this patient.   07/29/21 Corrie Dandy) Adeli Frost MPT  06/15/2021, 2:10 PM  [facility] [address] [place], [place], [place] Phone: [phone]   Fax:  [phone]  Name: Francis Alvarez MRN: [name] Date of Birth: April 22, 1976

## 2021-06-18 ENCOUNTER — Encounter (HOSPITAL_BASED_OUTPATIENT_CLINIC_OR_DEPARTMENT_OTHER): Payer: No Typology Code available for payment source | Attending: Family Medicine | Admitting: Physical Therapy

## 2021-06-18 ENCOUNTER — Other Ambulatory Visit: Payer: Self-pay

## 2021-06-18 ENCOUNTER — Encounter (HOSPITAL_BASED_OUTPATIENT_CLINIC_OR_DEPARTMENT_OTHER): Payer: Self-pay | Admitting: Physical Therapy

## 2021-06-18 DIAGNOSIS — M6281 Muscle weakness (generalized): Secondary | ICD-10-CM | POA: Diagnosis present

## 2021-06-18 DIAGNOSIS — G8929 Other chronic pain: Secondary | ICD-10-CM | POA: Diagnosis present

## 2021-06-18 DIAGNOSIS — R2681 Unsteadiness on feet: Secondary | ICD-10-CM | POA: Diagnosis present

## 2021-06-18 DIAGNOSIS — R293 Abnormal posture: Secondary | ICD-10-CM

## 2021-06-18 DIAGNOSIS — R2689 Other abnormalities of gait and mobility: Secondary | ICD-10-CM

## 2021-06-18 DIAGNOSIS — R262 Difficulty in walking, not elsewhere classified: Secondary | ICD-10-CM | POA: Diagnosis present

## 2021-06-18 DIAGNOSIS — M25561 Pain in right knee: Secondary | ICD-10-CM | POA: Diagnosis present

## 2021-06-18 NOTE — Therapy (Signed)
[facility] [address] [place], [place], [address] Phone: [phone]   Fax:  [phone]  Physical Therapy Treatment  Patient Details  Name: Francis Alvarez MRN: [id number] Date of Birth: [birth year] Referring Provider (PT): Cleophas Dunker   Encounter Date: 06/18/2021   PT End of Session - 06/18/21 1156     Visit Number 11    Number of Visits 13    Date for PT Re-Evaluation 06/22/21    PT Start Time 1200    PT Stop Time 1250    PT Time Calculation (min) 50 min    Equipment Utilized During Treatment Other (comment)    Activity Tolerance Patient tolerated treatment well    Behavior During Therapy [facility] for tasks assessed/performed             History reviewed. No pertinent past medical history.  History reviewed. No pertinent surgical history.  There were no vitals filed for this visit.   Subjective Assessment - 06/18/21 1306     Subjective "I'm good, feel good"    Pain Orientation Right;Lower    Pain Descriptors / Indicators Sore    Pain Onset 1 to 4 weeks ago    Pain Frequency Occasional                                          PT Short Term Goals - 06/18/21 1307       PT SHORT TERM GOAL #1   Baseline Demonstrates today with good form, no discomfort.    Status Achieved               PT Long Term Goals - 06/18/21 1308       PT LONG TERM GOAL #3   Baseline Pt reporting no pain and completion using proper technique    Status Achieved                 Pt seen for aquatic therapy today.  Treatment took place in water 3.25-4.8 ft in depth at the [facility] pool. Temp of water was 94.  Pt entered/exited the pool via stairs step to pattern independently with bilat rail.   Warm up: forward, backward (posterior core activation) and side stepping/walking cues for increased step length, increased speed, hand placement to increase resistance.      Standing Stretching: -Runner stretch for hamstring and gastroc 3 x 30 second hold -pike positioning at bottom step holding to handrails for low and mid back stretch/erector spinae 3 x 30 second hold.  -right then left hip hiking in pike position for lateral core stretch 3 x 30 sec hold   Standing Kick board rotation 2 x  15 R/L.  Board completely submerged noodle kick down using yellow noodle 2 x 10 neutral then external hip rotation   In 4.6 ft Using 2.5 foam hand buoys: Standing<>sup holding x 15 seconds 3 reps. Standing<>prone holding x 15 sec 3 rep Supine suspension knees to chest 3x 10 completed abdominal crunches -knees to right then left shoulder rotator crunches  Prone flys 3 x 10 using 2.5 foam buoys           -prone hold with buoys submerged at chest level using 2.5 foam hand buoys challenge to hold "tight core" 10-15 sec.  Multiple tries  sup<>prone pendulum 2 x 10 lateral pendulum: vc and demonstration. Held x 10 seconds R/L for stretch  then 2 x 10 rotations for strengthening.  VC for focus on proper muscle engagement.       Pt requires buoyancy for support and to offload joints with strengthening exercises. Viscosity of the water is needed for resistance of strengthening; water current perturbations provides challenge to standing balance unsupported, requiring increased core activation.        Plan - 06/18/21 1250     Clinical Impression Statement Pt instructed through lamentated HEP for aquatics.  Adjustments made.  He will complete indep prior to next last visit which is DC. He demonstrates improved strength in core as evidenced by completion of entire HEP with 3 recovery periods. He is completing increased reps and/or trials. Some minor LB discomfort continues pt reports although he feels it is due to his level of exercising.  It is intermittent and low.    Stability/Clinical Decision Making Stable/Uncomplicated    Clinical Decision Making Low    Rehab Potential  Good    PT Frequency 2x / week    PT Duration 6 weeks    PT Treatment/Interventions ADLs/Self Care Home Management;Aquatic Therapy;Cryotherapy;Electrical Stimulation;Ultrasound;Traction;Moist Heat;Iontophoresis 4mg /ml Dexamethasone;Functional mobility training;Therapeutic activities;Therapeutic exercise;Neuromuscular re-education;Manual techniques;Patient/family education;Passive range of motion;Dry needling;Taping    PT Next Visit Plan DC    PT Home Exercise Plan [id number]  come to upright posture every 45 min when at work. 05/29/21 Added aquatics.             Patient will benefit from skilled therapeutic intervention in order to improve the following deficits and impairments:  Increased muscle spasms, Decreased activity tolerance, Pain, Improper body mechanics, Impaired flexibility, Decreased strength, Postural dysfunction  Visit Diagnosis: Abnormal posture  Other abnormalities of gait and mobility  Unsteadiness on feet  Chronic pain of right knee  Difficulty in walking, not elsewhere classified  Muscle weakness (generalized)     Problem List There are no problems to display for this patient.  [address] [place]) Francis Alvarez MPT 06/18/2021, 1:09 PM  [facility] [address] [place], [place], [place] Phone: [phone]   Fax:  [phone]  Name: Francis Alvarez MRN: Francis Alvarez Date of Birth: 1976-02-10

## 2021-06-22 ENCOUNTER — Encounter (HOSPITAL_BASED_OUTPATIENT_CLINIC_OR_DEPARTMENT_OTHER): Payer: Self-pay | Admitting: Physical Therapy

## 2021-06-22 ENCOUNTER — Other Ambulatory Visit: Payer: Self-pay

## 2021-06-22 ENCOUNTER — Ambulatory Visit (HOSPITAL_BASED_OUTPATIENT_CLINIC_OR_DEPARTMENT_OTHER): Payer: No Typology Code available for payment source | Admitting: Physical Therapy

## 2021-06-22 DIAGNOSIS — G8929 Other chronic pain: Secondary | ICD-10-CM

## 2021-06-22 DIAGNOSIS — M25561 Pain in right knee: Secondary | ICD-10-CM

## 2021-06-22 DIAGNOSIS — M6281 Muscle weakness (generalized): Secondary | ICD-10-CM

## 2021-06-22 DIAGNOSIS — R2681 Unsteadiness on feet: Secondary | ICD-10-CM

## 2021-06-22 DIAGNOSIS — R293 Abnormal posture: Secondary | ICD-10-CM

## 2021-06-22 DIAGNOSIS — M545 Low back pain, unspecified: Secondary | ICD-10-CM | POA: Diagnosis not present

## 2021-06-22 DIAGNOSIS — R2689 Other abnormalities of gait and mobility: Secondary | ICD-10-CM

## 2021-06-22 DIAGNOSIS — R262 Difficulty in walking, not elsewhere classified: Secondary | ICD-10-CM

## 2021-06-22 NOTE — Therapy (Signed)
[place], [place], [address] Phone: [phone]   Fax:  [phone]  Physical Therapy Treatment  Patient Details  Name: Francis Alvarez MRN: [id number] Date of Birth: 03/09/1976 Referring Provider (PT): Layne Benton   Encounter Date: 06/22/2021   PT End of Session - 06/22/21 1004     Visit Number 12    Number of Visits 13    Date for PT Re-Evaluation 06/22/21    PT Start Time 0951    PT Stop Time 1035    PT Time Calculation (min) 44 min    Equipment Utilized During Treatment Other (comment)    Activity Tolerance Patient tolerated treatment well    Behavior During Therapy [facility] for tasks assessed/performed             History reviewed. No pertinent past medical history.  History reviewed. No pertinent surgical history.  There were no vitals filed for this visit.   Subjective Assessment - 06/22/21 1005     Subjective "I want to run through it tody by myself, No pain"    Currently in Pain? No/denies                                          PT Short Term Goals - 06/18/21 1307       PT SHORT TERM GOAL #1   Baseline Demonstrates today with good form, no discomfort.    Status Achieved               PT Long Term Goals - 06/22/21 1024       PT LONG TERM GOAL #1   Baseline Pt indep with lamentated HEP    Status Achieved      PT LONG TERM GOAL #2   Baseline 5/5    Status Achieved      PT LONG TERM GOAL #4   Baseline no pain.    Status Achieved            Pt seen for aquatic therapy today.  Treatment took place in water 3.25-4.8 ft in depth at the [facility] pool. Temp of water was 94.  Pt entered/exited the pool via stairs step to pattern independently with bilat rail.   Warm up: forward, backward (posterior core activation) and side stepping/walking cues for increased step length, increased speed, hand placement to increase  resistance.     Standing Stretching: Pt completing indep prior to session beginning -Runner stretch for hamstring and gastroc 3 x 30 second hold -pike positioning at bottom step holding to handrails for low and mid back stretch/erector spinae 3 x 30 second hold.  -right then left hip hiking in pike position for lateral core stretch 3 x 30 sec hold   Standing Kick board rotation 2 x  15 R/L.  Board completely submerged noodle kick down using yellow noodle 2 x 10 neutral then external hip rotation   In 4.6 ft Using 2.5 foam hand buoys: Standing<>sup holding x 15 seconds 3 reps. Standing<>prone holding x 15 sec 3 rep Supine suspension knees to chest 3x 10 completed abdominal crunches -knees to right then left shoulder rotator crunches  Prone flys 3 x 10 using 2.5 foam buoys           -prone hold with buoys submerged at chest level using 2.5 foam hand buoys challenge to hold "tight core" 10-15 sec.  Multiple tries   sup<>prone pendulum 2 x 10 lateral pendulum: vc and demonstration. Held x 10 seconds R/L for stretch then 2 x 10 rotations for strengthening.  VC for focus on proper muscle engagement.       Pt requires buoyancy for support and to offload joints with strengthening exercises. Viscosity of the water is needed for resistance of strengthening; water current perturbations provides challenge to standing balance unsupported, requiring increased core activation.        Plan - 06/22/21 1332     Clinical Impression Statement Pt completes session using HEP with minor verbal corrections.  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  Ready for DC.    Stability/Clinical Decision Making Stable/Uncomplicated    Clinical Decision Making Low    Rehab Potential Good    PT Frequency 2x / week    PT Treatment/Interventions ADLs/Self Care Home Management;Aquatic Therapy;Cryotherapy;Electrical Stimulation;Ultrasound;Traction;Moist Heat;Iontophoresis 4mg /ml  Dexamethasone;Functional mobility training;Therapeutic activities;Therapeutic exercise;Neuromuscular re-education;Manual techniques;Patient/family education;Passive range of motion;Dry needling;Taping             Patient will benefit from skilled therapeutic intervention in order to improve the following deficits and impairments:  Increased muscle spasms, Decreased activity tolerance, Pain, Improper body mechanics, Impaired flexibility, Decreased strength, Postural dysfunction  Visit Diagnosis: Abnormal posture  Other abnormalities of gait and mobility  Unsteadiness on feet  Chronic pain of right knee  Difficulty in walking, not elsewhere classified  Muscle weakness (generalized)     Problem List There are no problems to display for this patient. PHYSICAL THERAPY DISCHARGE SUMMARY  Visits from Start of Care: 05/07/2021  Current functional level related to goals / functional outcomes: Indep with all Adl's and functional mobility.  Indep with carring and baking pizzas   Remaining deficits: Very minor Muscle tightness in LB   Education / Equipment: HEP   Patient agrees to discharge. Patient goals were met. Patient is being discharged due to meeting the stated rehab goals.   [address] [place]) Betty Brooks MPT 06/22/2021, 1:33 PM  [facility] [address] [place], [place], [address] Phone: [phone]   Fax:  [phone]  Name: Kaizen Ibsen MRN: [id number] Date of Birth: Sep 14, 1975

## 2021-09-14 DIAGNOSIS — Z125 Encounter for screening for malignant neoplasm of prostate: Secondary | ICD-10-CM | POA: Diagnosis not present

## 2021-09-14 DIAGNOSIS — F5101 Primary insomnia: Secondary | ICD-10-CM | POA: Diagnosis not present

## 2021-09-14 DIAGNOSIS — R062 Wheezing: Secondary | ICD-10-CM | POA: Diagnosis not present

## 2021-09-14 DIAGNOSIS — Z131 Encounter for screening for diabetes mellitus: Secondary | ICD-10-CM | POA: Diagnosis not present

## 2021-09-14 DIAGNOSIS — E7800 Pure hypercholesterolemia, unspecified: Secondary | ICD-10-CM | POA: Diagnosis not present

## 2021-09-14 DIAGNOSIS — Z0000 Encounter for general adult medical examination without abnormal findings: Secondary | ICD-10-CM | POA: Diagnosis not present

## 2021-09-20 DIAGNOSIS — Z720 Tobacco use: Secondary | ICD-10-CM | POA: Diagnosis not present

## 2021-10-09 DIAGNOSIS — I8312 Varicose veins of left lower extremity with inflammation: Secondary | ICD-10-CM | POA: Diagnosis not present

## 2021-10-09 DIAGNOSIS — I8311 Varicose veins of right lower extremity with inflammation: Secondary | ICD-10-CM | POA: Diagnosis not present

## 2021-10-12 DIAGNOSIS — I8311 Varicose veins of right lower extremity with inflammation: Secondary | ICD-10-CM | POA: Diagnosis not present

## 2021-10-12 DIAGNOSIS — I8312 Varicose veins of left lower extremity with inflammation: Secondary | ICD-10-CM | POA: Diagnosis not present

## 2021-10-26 DIAGNOSIS — M5136 Other intervertebral disc degeneration, lumbar region: Secondary | ICD-10-CM | POA: Diagnosis not present

## 2021-10-26 DIAGNOSIS — M5451 Vertebrogenic low back pain: Secondary | ICD-10-CM | POA: Diagnosis not present

## 2021-11-06 DIAGNOSIS — M5451 Vertebrogenic low back pain: Secondary | ICD-10-CM | POA: Diagnosis not present

## 2021-12-14 DIAGNOSIS — M545 Low back pain, unspecified: Secondary | ICD-10-CM | POA: Diagnosis not present

## 2021-12-14 DIAGNOSIS — G4700 Insomnia, unspecified: Secondary | ICD-10-CM | POA: Diagnosis not present

## 2021-12-14 DIAGNOSIS — E7800 Pure hypercholesterolemia, unspecified: Secondary | ICD-10-CM | POA: Diagnosis not present

## 2021-12-21 DIAGNOSIS — E878 Other disorders of electrolyte and fluid balance, not elsewhere classified: Secondary | ICD-10-CM | POA: Diagnosis not present

## 2021-12-21 DIAGNOSIS — E7800 Pure hypercholesterolemia, unspecified: Secondary | ICD-10-CM | POA: Diagnosis not present

## 2022-10-03 DIAGNOSIS — Z0000 Encounter for general adult medical examination without abnormal findings: Secondary | ICD-10-CM | POA: Diagnosis not present

## 2022-10-18 DIAGNOSIS — Z0000 Encounter for general adult medical examination without abnormal findings: Secondary | ICD-10-CM | POA: Diagnosis not present

## 2022-10-18 DIAGNOSIS — E7800 Pure hypercholesterolemia, unspecified: Secondary | ICD-10-CM | POA: Diagnosis not present

## 2022-10-18 DIAGNOSIS — Z131 Encounter for screening for diabetes mellitus: Secondary | ICD-10-CM | POA: Diagnosis not present

## 2022-10-18 DIAGNOSIS — Z125 Encounter for screening for malignant neoplasm of prostate: Secondary | ICD-10-CM | POA: Diagnosis not present

## 2022-11-21 DIAGNOSIS — J0100 Acute maxillary sinusitis, unspecified: Secondary | ICD-10-CM | POA: Diagnosis not present

## 2022-11-21 DIAGNOSIS — J069 Acute upper respiratory infection, unspecified: Secondary | ICD-10-CM | POA: Diagnosis not present

## 2023-01-03 DIAGNOSIS — L301 Dyshidrosis [pompholyx]: Secondary | ICD-10-CM | POA: Diagnosis not present

## 2023-01-03 DIAGNOSIS — Z720 Tobacco use: Secondary | ICD-10-CM | POA: Diagnosis not present

## 2023-10-13 DIAGNOSIS — Z720 Tobacco use: Secondary | ICD-10-CM | POA: Diagnosis not present

## 2023-10-13 DIAGNOSIS — Z0000 Encounter for general adult medical examination without abnormal findings: Secondary | ICD-10-CM | POA: Diagnosis not present

## 2023-10-13 DIAGNOSIS — Z131 Encounter for screening for diabetes mellitus: Secondary | ICD-10-CM | POA: Diagnosis not present

## 2023-10-13 DIAGNOSIS — Z125 Encounter for screening for malignant neoplasm of prostate: Secondary | ICD-10-CM | POA: Diagnosis not present

## 2023-10-13 DIAGNOSIS — E7800 Pure hypercholesterolemia, unspecified: Secondary | ICD-10-CM | POA: Diagnosis not present

## 2024-05-04 DIAGNOSIS — M545 Low back pain, unspecified: Secondary | ICD-10-CM | POA: Diagnosis not present

## 2024-05-31 DIAGNOSIS — M5451 Vertebrogenic low back pain: Secondary | ICD-10-CM | POA: Diagnosis not present

## 2024-05-31 DIAGNOSIS — M545 Low back pain, unspecified: Secondary | ICD-10-CM | POA: Diagnosis not present

## 2024-06-07 DIAGNOSIS — M5451 Vertebrogenic low back pain: Secondary | ICD-10-CM | POA: Diagnosis not present

## 2024-06-28 DIAGNOSIS — M5451 Vertebrogenic low back pain: Secondary | ICD-10-CM | POA: Diagnosis not present

## 2024-07-13 DIAGNOSIS — K640 First degree hemorrhoids: Secondary | ICD-10-CM | POA: Diagnosis not present

## 2024-07-13 DIAGNOSIS — Z1211 Encounter for screening for malignant neoplasm of colon: Secondary | ICD-10-CM | POA: Diagnosis not present

## 2024-07-13 DIAGNOSIS — D128 Benign neoplasm of rectum: Secondary | ICD-10-CM | POA: Diagnosis not present

## 2024-07-27 DIAGNOSIS — M4726 Other spondylosis with radiculopathy, lumbar region: Secondary | ICD-10-CM | POA: Diagnosis not present

## 2024-07-27 DIAGNOSIS — M545 Low back pain, unspecified: Secondary | ICD-10-CM | POA: Diagnosis not present

## 2024-07-27 DIAGNOSIS — M5432 Sciatica, left side: Secondary | ICD-10-CM | POA: Diagnosis not present

## 2024-07-27 DIAGNOSIS — Z1330 Encounter for screening examination for mental health and behavioral disorders, unspecified: Secondary | ICD-10-CM | POA: Diagnosis not present

## 2024-07-27 DIAGNOSIS — M5431 Sciatica, right side: Secondary | ICD-10-CM | POA: Diagnosis not present

## 2024-08-06 DIAGNOSIS — M5126 Other intervertebral disc displacement, lumbar region: Secondary | ICD-10-CM | POA: Diagnosis not present

## 2024-08-13 DIAGNOSIS — J189 Pneumonia, unspecified organism: Secondary | ICD-10-CM | POA: Diagnosis not present
